# Patient Record
Sex: Male | Born: 1950 | Race: Asian | Hispanic: No | Marital: Married | Smoking: Former smoker
Health system: Southern US, Community
[De-identification: ages and names within clinical notes are randomized; demographics above are authoritative.]

## PROBLEM LIST (undated history)

## (undated) HISTORY — PX: THYROIDECTOMY: SHX17

## (undated) HISTORY — PX: COLONOSCOPY: SHX174

---

## 2013-10-03 ENCOUNTER — Other Ambulatory Visit: Payer: Self-pay | Admitting: Internal Medicine

## 2013-10-03 ENCOUNTER — Ambulatory Visit
Admission: RE | Admit: 2013-10-03 | Discharge: 2013-10-03 | Disposition: A | Discharge status: Home / Self Care | Payer: No Typology Code available for payment source | Source: Ambulatory Visit | Attending: Internal Medicine | Admitting: Internal Medicine

## 2013-10-03 DIAGNOSIS — R52 Pain, unspecified: Secondary | ICD-10-CM

## 2013-10-03 DIAGNOSIS — M542 Cervicalgia: Secondary | ICD-10-CM

## 2013-10-06 ENCOUNTER — Ambulatory Visit
Admission: RE | Admit: 2013-10-06 | Discharge: 2013-10-06 | Disposition: A | Discharge status: Home / Self Care | Payer: No Typology Code available for payment source | Source: Ambulatory Visit | Attending: Internal Medicine | Admitting: Internal Medicine

## 2013-10-06 DIAGNOSIS — M542 Cervicalgia: Secondary | ICD-10-CM

## 2013-10-09 ENCOUNTER — Other Ambulatory Visit: Payer: Self-pay | Admitting: Internal Medicine

## 2013-10-09 DIAGNOSIS — E041 Nontoxic single thyroid nodule: Secondary | ICD-10-CM

## 2013-10-18 ENCOUNTER — Ambulatory Visit
Admission: RE | Admit: 2013-10-18 | Discharge: 2013-10-18 | Disposition: A | Discharge status: Home / Self Care | Payer: No Typology Code available for payment source | Source: Ambulatory Visit | Attending: Internal Medicine | Admitting: Internal Medicine

## 2013-10-18 ENCOUNTER — Other Ambulatory Visit (HOSPITAL_COMMUNITY)
Admission: RE | Admit: 2013-10-18 | Discharge: 2013-10-18 | Disposition: A | Discharge status: Home / Self Care | Payer: No Typology Code available for payment source | Source: Ambulatory Visit | Attending: Interventional Radiology | Admitting: Interventional Radiology

## 2013-10-18 DIAGNOSIS — E041 Nontoxic single thyroid nodule: Secondary | ICD-10-CM

## 2014-10-17 ENCOUNTER — Other Ambulatory Visit: Payer: Self-pay | Admitting: Internal Medicine

## 2014-10-17 DIAGNOSIS — E042 Nontoxic multinodular goiter: Secondary | ICD-10-CM

## 2014-10-22 ENCOUNTER — Ambulatory Visit
Admission: RE | Admit: 2014-10-22 | Discharge: 2014-10-22 | Disposition: A | Discharge status: Home / Self Care | Payer: BLUE CROSS/BLUE SHIELD | Source: Ambulatory Visit | Attending: Internal Medicine | Admitting: Internal Medicine

## 2014-10-22 DIAGNOSIS — E042 Nontoxic multinodular goiter: Secondary | ICD-10-CM

## 2016-04-21 ENCOUNTER — Other Ambulatory Visit: Payer: Self-pay | Admitting: Internal Medicine

## 2016-04-21 DIAGNOSIS — E042 Nontoxic multinodular goiter: Secondary | ICD-10-CM

## 2016-04-29 ENCOUNTER — Other Ambulatory Visit: Payer: BLUE CROSS/BLUE SHIELD

## 2018-05-27 ENCOUNTER — Other Ambulatory Visit: Payer: Self-pay | Admitting: Internal Medicine

## 2018-05-27 DIAGNOSIS — E042 Nontoxic multinodular goiter: Secondary | ICD-10-CM

## 2018-06-03 ENCOUNTER — Ambulatory Visit
Admission: RE | Admit: 2018-06-03 | Discharge: 2018-06-03 | Disposition: A | Discharge status: Home / Self Care | Payer: BLUE CROSS/BLUE SHIELD | Source: Ambulatory Visit | Attending: Internal Medicine | Admitting: Internal Medicine

## 2018-06-03 DIAGNOSIS — E042 Nontoxic multinodular goiter: Secondary | ICD-10-CM

## 2018-11-24 ENCOUNTER — Ambulatory Visit (INDEPENDENT_AMBULATORY_CARE_PROVIDER_SITE_OTHER): Payer: Medicare Other

## 2018-11-24 ENCOUNTER — Encounter (INDEPENDENT_AMBULATORY_CARE_PROVIDER_SITE_OTHER): Payer: Self-pay | Admitting: Orthopedic Surgery

## 2018-11-24 ENCOUNTER — Ambulatory Visit (INDEPENDENT_AMBULATORY_CARE_PROVIDER_SITE_OTHER): Payer: Medicare Other | Admitting: Orthopedic Surgery

## 2018-11-24 DIAGNOSIS — M542 Cervicalgia: Secondary | ICD-10-CM

## 2018-11-24 DIAGNOSIS — M25512 Pain in left shoulder: Secondary | ICD-10-CM

## 2018-11-24 DIAGNOSIS — M79602 Pain in left arm: Secondary | ICD-10-CM

## 2018-11-24 DIAGNOSIS — G8929 Other chronic pain: Secondary | ICD-10-CM

## 2018-11-24 DIAGNOSIS — M50323 Other cervical disc degeneration at C6-C7 level: Secondary | ICD-10-CM

## 2018-11-24 MED ORDER — PREDNISONE 5 MG (21) PO TBPK: ORAL_TABLET | ORAL | 0 refills | Status: DC

## 2018-11-24 MED ORDER — METHOCARBAMOL 500 MG PO TABS: ORAL_TABLET | ORAL | 0 refills | Status: DC

## 2018-11-24 NOTE — Progress Notes (Signed)
Office Visit Note   Patient: Troy Larsen           Date of Birth: 1951-07-03           MRN: [id number] Visit Date: 11/24/2018 Requested by: Ralene Ok, MD [address] [facility] [address] [place], [place] [address] PCP: Ralene Ok, MD  Subjective: Chief Complaint  Patient presents with  . Left Shoulder - Pain  . Neck - Pain    HPI: Patient presents for evaluation of shoulder and neck pain primarily on the left-hand side.'s been going on for about 4 years.  Denies any history of injury.  He reports that the pain started in his left shoulder when he was receiving some immunizations before going to [place].  He is right-hand dominant.  He reports radicular pain which radiates down the arm.  It wakes him from sleep at night.  Reports numbness and tingling in the fingertips on the left-hand side but not the right.  He states that the pain is now radiating to the neck as well.  Advil has not been helpful.  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  He reports that he has pain every day more or less all the time but it is chronic.  The pain never goes away.  Does not improve with arm elevation over the head.              ROS: All systems reviewed are negative as they relate to the chief complaint within the history of present illness.  Patient denies  fevers or chills.   Assessment & Plan: Visit Diagnoses:  1. Left arm pain   2. Cervicalgia   3. Chronic left shoulder pain     Plan: Impression is radicular pain in the left arm with pretty normal shoulder exam and radiographs.  Plan is Medrol Dosepak with Robaxin and MRI of the cervical spine.  He does have C6-7 degenerative disc disease on plain radiographs.  His history and exam are more consistent with radiculopathy as opposed to intrinsic shoulder pathology.  I will see him back after that study.  Follow-Up Instructions: Return for after MRI.   Orders:  Orders Placed This Encounter  Procedures  . XR Shoulder  Left  . XR Cervical Spine 2 or 3 views  . MR Cervical Spine w/o contrast   Meds ordered this encounter  Medications  . predniSONE (STERAPRED UNI-PAK 21 TAB) 5 MG (21) TBPK tablet    Sig: Take dosepak as directed    Dispense:  21 tablet    Refill:  0  . methocarbamol (ROBAXIN) 500 MG tablet    Sig: 1 po q 8 hrs prn    Dispense:  30 tablet    Refill:  0      Procedures: No procedures performed   Clinical Data: No additional findings.  Objective: Vital Signs: There were no vitals taken for this visit.  Physical Exam:   Constitutional: Patient appears well-developed HEENT:  Head: Normocephalic Eyes:EOM are normal Neck: Normal range of motion Cardiovascular: Normal rate Pulmonary/chest: Effort normal Neurologic: Patient is alert Skin: Skin is warm Psychiatric: Patient has normal mood and affect    Ortho Exam: Ortho exam demonstrates full active and passive range of motion of the cervical spine.  He also has excellent range of motion of the left shoulder with no coarse grinding or crepitus with active or passive range of motion.  Rotator cuff strength is excellent infraspinatus supraspinatus subscap muscle testing.  No masses lymphadenopathy or skin changes noted in the shoulder girdle region.  He has no AC joint tenderness and negative impingement signs on the left.  No definite paresthesias C5-T1.  Patient has good grip EPL FPL interosseous wrist flexion extension bicep triceps and deltoid strength.  Specialty Comments:  No specialty comments available.  Imaging: Xr Cervical Spine 2 Or 3 Views  Result Date: 11/24/2018 AP lateral cervical spine reviewed.  Lateral cervical spine is the highest quality of the radiographs.  Degenerative disc disease is present at C6-7.  Normal lordosis is present.  Mild facet arthritis noted.  Xr Shoulder Left  Result Date: 11/24/2018 AP outlet and axillary left shoulder reviewed.  No glenohumeral arthritis or AC joint arthritis is  present.  Acromiohumeral distance is normal.  Visualized lung fields clear.  No fracture dislocation noted.  Normal left shoulder    PMFS History: There are no active problems to display for this patient.  History reviewed. No pertinent past medical history.  History reviewed. No pertinent family history.  History reviewed. No pertinent surgical history. Social History   Occupational History  . Not on file  Tobacco Use  . Smoking status: Not on file  Substance and Sexual Activity  . Alcohol use: Not on file  . Drug use: Not on file  . Sexual activity: Not on file

## 2018-11-29 ENCOUNTER — Ambulatory Visit
Admission: RE | Admit: 2018-11-29 | Discharge: 2018-11-29 | Disposition: A | Discharge status: Home / Self Care | Payer: Medicare Other | Source: Ambulatory Visit | Attending: Orthopedic Surgery | Admitting: Orthopedic Surgery

## 2018-11-29 DIAGNOSIS — M79602 Pain in left arm: Secondary | ICD-10-CM

## 2019-01-10 ENCOUNTER — Telehealth (INDEPENDENT_AMBULATORY_CARE_PROVIDER_SITE_OTHER): Payer: Self-pay | Admitting: Orthopedic Surgery

## 2019-01-10 NOTE — Telephone Encounter (Signed)
Patient request a call with the results of his mri. Patient's callback # [phone]

## 2019-01-10 NOTE — Telephone Encounter (Signed)
See message below. Please call patient with MRI Results. Thanks.

## 2019-01-11 NOTE — Telephone Encounter (Signed)
Nerve compression and stenosisi and herniated disc refer to Troy Larsen for f/u

## 2019-01-11 NOTE — Telephone Encounter (Signed)
IC s/w patient. Appt scheduled.

## 2019-01-20 ENCOUNTER — Encounter (INDEPENDENT_AMBULATORY_CARE_PROVIDER_SITE_OTHER): Payer: Self-pay | Admitting: Orthopaedic Surgery

## 2019-01-20 ENCOUNTER — Other Ambulatory Visit: Payer: Self-pay

## 2019-01-20 ENCOUNTER — Ambulatory Visit (INDEPENDENT_AMBULATORY_CARE_PROVIDER_SITE_OTHER): Payer: Medicare Other | Admitting: Orthopaedic Surgery

## 2019-01-20 VITALS — Ht 65.5 in | Wt 160.0 lb

## 2019-01-20 DIAGNOSIS — M4802 Spinal stenosis, cervical region: Secondary | ICD-10-CM | POA: Diagnosis not present

## 2019-01-20 DIAGNOSIS — M5020 Other cervical disc displacement, unspecified cervical region: Secondary | ICD-10-CM | POA: Diagnosis not present

## 2019-01-20 DIAGNOSIS — G9589 Other specified diseases of spinal cord: Secondary | ICD-10-CM

## 2019-01-20 NOTE — Progress Notes (Signed)
Office Visit Note   Patient: Troy Larsen           Date of Birth: 08/03/1951           MRN: [id number] Visit Date: 01/20/2019              Requested by: Ralene Ok, MD [address] [place], [place] [address] PCP: Ralene Ok, MD   Assessment & Plan: Visit Diagnoses:  1. Spinal stenosis of cervical region   2. Cervical cord myelomalacia (HCC)     Plan: Patient has 2 problems one is some disc protrusion at C6-7 on the left and this is giving him some radicular symptoms but he has no weakness and no nerve root tension signs.  He also has the cervical stenosis which is moderate to severe and there is cord myelopathic changes.  This corresponds to the C3-4 level where he has moderate to severe stenosis.  We discussed options for operative intervention discussed the C6-7 level which is giving him some radicular symptoms but no weakness.  The concern is the area where he has severe cervical stenosis with narrowing down below 7 mm in cord myelomalacia which fortunately is not associated with severe myelopathic changes.  Patient can discuss recommendations with his wife.  Long as his radicular symptoms do not progress he could have single level cervical fusion at C3-4.  He wants to delay the surgery until his next [facility] training trip and this would put surgery to mid to late August we discussed avoiding bumpy activities such as 4 wheeling, motorcycles, jumping skydiving or anything that causes bouncing or could lead to neck injury.  I will check him back again in a couple months and we also discussed possibility of treating both the C6-7 level as well as the C3-4 level at the same time 3 separate incisions.  Questions were elicited and answered we reviewed the MRI given copy of the report.  If he gets any progression of symptoms he will return sooner. Recheck one month.  Follow-Up Instructions: Return in about 1 month (around 02/19/2019).   Orders:  No orders of the defined types were placed in  this encounter.  No orders of the defined types were placed in this encounter.     Procedures: No procedures performed   Clinical Data: No additional findings.   Subjective: Chief Complaint  Patient presents with  . Neck - Pain    HPI 68 year old male with multiyear history of neck and shoulder pain with now progressive left hand numbness and tingling that radiates down to the long finger.  The hand bothers him at night sometimes neck pain wakes him up.  He denies stumbling or falling no gait disturbance.  Patient is used anti-inflammatories including Advil without relief.  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  Pain is constant time is severe other times is more mild.  He gets some improvement when he has his arm up over his head on the left negative on the right.  Patient's had an MRI scan which showed 2 problems.  One was cord myelomalacia with cervical stenosis at the C3-4 level.  The other problem was left-sided disc protrusion at the C6-7 level consistent with his radicular symptoms on the left side.  Patient also does some training sessions at [facility].  Patient denies any problems with stairs walking up hills no balance problems.  Review of Systems 14 point systems positive for  cervical stenosis with left upper extremity radicular symptoms.  Cord myelomalacia without myelopathic symptoms.  Otherwise cardiovascular respiratory endocrine negative is obtains HPI.  Objective: Vital Signs: Ht 5' 5.5" (1.664 m)   Wt 160 lb (72.6 kg)   BMI 26.22 kg/m   Physical Exam Constitutional:      Appearance: He is well-developed.  HENT:     Head: Normocephalic and atraumatic.  Eyes:     Pupils: Pupils are equal, round, and reactive to light.  Neck:     Thyroid: No thyromegaly.     Trachea: No tracheal deviation.  Cardiovascular:     Rate and Rhythm: Normal rate.  Pulmonary:     Effort: Pulmonary effort is  normal.     Breath sounds: No wheezing.  Abdominal:     General: Bowel sounds are normal.     Palpations: Abdomen is soft.  Skin:    General: Skin is warm and dry.     Capillary Refill: Capillary refill takes less than 2 seconds.  Neurological:     Mental Status: He is alert and oriented to person, place, and time.  Psychiatric:        Behavior: Behavior normal.        Thought Content: Thought content normal.        Judgment: Judgment normal.     Ortho Exam patient has no increased neck pain with cervical compression no change with distraction.  Mild brachial plexus tenderness on the left negative on the right.  Negative impingement in the shoulders full range of motion good strength deltoids of biceps triceps.  Infraspinatus subscap testing is normal.  No supraclavicular lymphadenopathy no rash over exposed skin no upper semi-atrophy.  Negative crossarm abduction test.  Negative Spurling right and left.  No thenar or hyperthenar atrophy.  Biceps triceps brachial radialis right and left are 3+ as well as 3+ knee and ankle jerk symmetrical.  Normal heel toe gait with no weakness.  Interossei of the hand flexors and extenders are normal to the digits.  Specialty Comments:  No specialty comments available.  Imaging:CLINICAL DATA:  Two year history neck pain and shoulder pain with numbness of the left hand.  EXAM: MRI CERVICAL SPINE WITHOUT CONTRAST  TECHNIQUE: Multiplanar, multisequence MR imaging of the cervical spine was performed. No intravenous contrast was administered.  COMPARISON:  Radiography 11/24/2018  FINDINGS: Alignment: Straightening of the normal cervical lordosis.  Vertebrae: No fracture or primary bone lesion.  Cord: Myelomalacia at the C3-4 level as described above.  Posterior Fossa, vertebral arteries, paraspinal tissues: Negative  Disc levels:  No abnormality at the foramen magnum or C1-2.  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 Mild bulging of the  disc. No compressive canal or foraminal stenosis. AP canal diameter 9 mm.  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 Spondylosis with endplate osteophytes and bulging of the disc. Bilateral facet degeneration. Canal stenosis shows AP diameter of 7 mm. The cord is flattened and shows increased T2 signal consistent with bilateral myelomalacia. Moderate bilateral foraminal stenosis.  C4-5: Normal interspace.  C5-6: Bulging of the disc with a shallow left paracentral protrusion with slight caudal migration. No cord compression. Mild left foraminal stenosis.  C6-7: Broad-based disc herniation more prominent towards the left. Narrowing of the subarachnoid space. Mild flattening of the left side of the cord. AP diameter in the midline 7.7 mm. Bilateral foraminal stenosis left worse than right.  C7-T1: Minimal disc bulge. Facet ligamentous prominence on the right. Mild right foraminal narrowing.  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  Chronic spondylosis with endplate osteophytes and protruding disc material. AP diameter of the canal only 7 mm. Cord atrophy and increased T2 signal consistent with chronic myelomalacia, bilateral. Bilateral foraminal stenosis that cause neural compression.  C5-6: Shallow left paracentral disc protrusion with slight caudal migration. Canal stenosis without cord compression. Foraminal narrowing left worse than right.  C6-7: Broad-based disc herniation more prominent towards the left. Canal narrowing with slight indentation of the left side of the cord. Bilateral foraminal stenosis left worse than right.   Electronically Signed   By: Paulina Fusi M.D.   On: 11/29/2018 09:53     PMFS History: There are no active problems to display for this patient.  No past medical history on file.  No family history on file.  No past surgical history on file. Social History   Occupational History  . Not on file  Tobacco Use  . Smoking status: Current  Some Day Smoker  . Smokeless tobacco: Never Used  Substance and Sexual Activity  . Alcohol use: Not on file  . Drug use: Not on file  . Sexual activity: Not on file

## 2019-01-21 DIAGNOSIS — M4802 Spinal stenosis, cervical region: Secondary | ICD-10-CM | POA: Insufficient documentation

## 2019-01-21 DIAGNOSIS — G9589 Other specified diseases of spinal cord: Secondary | ICD-10-CM | POA: Insufficient documentation

## 2019-01-21 DIAGNOSIS — M5020 Other cervical disc displacement, unspecified cervical region: Secondary | ICD-10-CM | POA: Insufficient documentation

## 2019-02-17 ENCOUNTER — Encounter: Payer: Self-pay | Admitting: Orthopaedic Surgery

## 2019-02-17 ENCOUNTER — Ambulatory Visit (INDEPENDENT_AMBULATORY_CARE_PROVIDER_SITE_OTHER): Payer: Medicare Other | Admitting: Orthopaedic Surgery

## 2019-02-17 ENCOUNTER — Other Ambulatory Visit: Payer: Self-pay

## 2019-02-17 VITALS — Ht 65.0 in | Wt 160.0 lb

## 2019-02-17 DIAGNOSIS — M7542 Impingement syndrome of left shoulder: Secondary | ICD-10-CM

## 2019-02-17 DIAGNOSIS — M4802 Spinal stenosis, cervical region: Secondary | ICD-10-CM

## 2019-02-17 DIAGNOSIS — G9589 Other specified diseases of spinal cord: Secondary | ICD-10-CM | POA: Diagnosis not present

## 2019-02-17 MED ORDER — LIDOCAINE HCL 1 % IJ SOLN
3.0000 mL | INTRAMUSCULAR | Status: AC | PRN
  Administered 2019-02-17: 3 mL

## 2019-02-17 MED ORDER — METHYLPREDNISOLONE ACETATE 40 MG/ML IJ SUSP
40.0000 mg | INTRAMUSCULAR | Status: AC | PRN
  Administered 2019-02-17: 11:00:00 40 mg via INTRA_ARTICULAR

## 2019-02-17 MED ORDER — BUPIVACAINE HCL 0.25 % IJ SOLN
4.0000 mL | INTRAMUSCULAR | Status: AC | PRN
  Administered 2019-02-17: 4 mL via INTRA_ARTICULAR

## 2019-02-17 NOTE — Progress Notes (Signed)
Office Visit Note   Patient: Troy Larsen           Date of Birth: 1951-02-20           MRN: [id number] Visit Date: 02/17/2019              Requested by: Ralene OkMoreira, Roy, MD [address] [place], [place] [id number] PCP: Ralene OkMoreira, Roy, MD   Assessment & Plan: Visit Diagnoses:  1. Spinal stenosis of cervical region   2. Cervical cord myelomalacia (HCC)   3. Impingement syndrome of left shoulder     Plan: In regards to patient's neck again he does have C3-4 stenosis with cord signal changes.  Patient again states that he is not able to have surgical invention now due to him needing to work.  He did bring up possible timing of the surgery around July, August or September.  I did advise him that he is at increased risk of paralysis due to the stenosis that he has and patient voices understanding.  Regards to left shoulder pain offered conservative treatment with injection.  After patient consent left shoulder prepped with Betadine and subacromial Marcaine/Depo-Medrol injection performed.  After sitting for a couple of minutes patient reported excellent relief of the left shoulder pain only and his range of motion also improved.  I advised him to take it easy over the weekend and not work.  Follow-up with Dr. Ophelia CharterYates for recheck of neck and shoulder at that time.  If shoulder continues to be a problem we may need to get an MRI to better evaluate his rotator cuff and will refer back to Dr. August Saucerean for that if needed.    Follow-Up Instructions: Return in about 4 weeks (around 03/17/2019) for recheck neck and left shoulder.   Orders:  No orders of the defined types were placed in this encounter.  No orders of the defined types were placed in this encounter.     Procedures: Large Joint Inj: L subacromial bursa on 02/17/2019 11:02 AM Indications: pain Details: 25 G 1.5 in needle, posterior approach Medications: 3 mL lidocaine 1 %; 4 mL bupivacaine 0.25 %; 40 mg methylPREDNISolone acetate 40 MG/ML Patient  was prepped and draped in the usual sterile fashion.       Clinical Data: No additional findings.   Subjective: Chief Complaint  Patient presents with  . Neck - Pain, Follow-up    HPI 68 year old male with a history of C3-4 stenosis cord signal changes comes in for recheck.  Dr. Ophelia CharterYates had previously discussed doing C3-4 ACDF and patient had mentioned doing this possibly in August.  He states that he does have to work and July August or September might be possible.  He has been explained the risk of not having his cervical spine treated.  He does continue to work which involves a fair amount of strenuous activity.  He also complains of ongoing left shoulder pain.  He has seen Dr. August Saucerean for this in the past.  Left shoulder pain with overhead activity and reaching on his back.  Today he denies feeling of unsteady gait, upper or lower extremity weakness. Review of Systems No current cardiac pulmonary GI GU issues  Objective: Vital Signs: Ht 5\' 5"  (1.651 m)   Wt 160 lb (72.6 kg)   BMI 26.63 kg/m   Physical Exam HENT:     Head: Normocephalic.  Pulmonary:     Effort: No respiratory distress.  Musculoskeletal:     Comments: Left shoulder he does have some  limitation in range of motion with flexion/abduction and internal rotation on his back.  Has pain with these maneuvers.  Moderate to markedly positive impingement test.  Negative drop arm.  Pain with supraspinatus resistance.  Right shoulder unremarkable.  No upper or lower extremity weakness.  Gait is normal.  Neurological:     Mental Status: He is alert and oriented to person, place, and time.  Psychiatric:        Mood and Affect: Mood normal.     Ortho Exam  Specialty Comments:  No specialty comments available.  Imaging: No results found.   PMFS History: Patient Active Problem List   Diagnosis Date Noted  . Cervical cord myelomalacia (HCC) 01/21/2019  . Spinal stenosis of cervical region 01/21/2019  . Protrusion of  cervical intervertebral disc 01/21/2019   History reviewed. No pertinent past medical history.  History reviewed. No pertinent family history.  History reviewed. No pertinent surgical history. Social History   Occupational History  . Not on file  Tobacco Use  . Smoking status: Current Some Day Smoker  . Smokeless tobacco: Never Used  Substance and Sexual Activity  . Alcohol use: Not on file  . Drug use: Not on file  . Sexual activity: Not on file

## 2019-03-31 ENCOUNTER — Encounter: Payer: Self-pay | Admitting: Orthopaedic Surgery

## 2019-03-31 ENCOUNTER — Ambulatory Visit (INDEPENDENT_AMBULATORY_CARE_PROVIDER_SITE_OTHER): Payer: Medicare Other | Admitting: Orthopaedic Surgery

## 2019-03-31 ENCOUNTER — Other Ambulatory Visit: Payer: Self-pay

## 2019-03-31 VITALS — Wt 160.0 lb

## 2019-03-31 DIAGNOSIS — M25512 Pain in left shoulder: Secondary | ICD-10-CM

## 2019-03-31 DIAGNOSIS — M5020 Other cervical disc displacement, unspecified cervical region: Secondary | ICD-10-CM

## 2019-03-31 DIAGNOSIS — M4802 Spinal stenosis, cervical region: Secondary | ICD-10-CM | POA: Diagnosis not present

## 2019-03-31 DIAGNOSIS — G8929 Other chronic pain: Secondary | ICD-10-CM

## 2019-03-31 DIAGNOSIS — G9589 Other specified diseases of spinal cord: Secondary | ICD-10-CM | POA: Diagnosis not present

## 2019-03-31 DIAGNOSIS — M7542 Impingement syndrome of left shoulder: Secondary | ICD-10-CM | POA: Diagnosis not present

## 2019-03-31 NOTE — Progress Notes (Signed)
Office Visit Note   Patient: Troy Larsen           Date of Birth: 07-15-1951           MRN: [id number] Visit Date: 03/31/2019              Requested by: Jilda Panda, MD [id number] [place],  [place] [address] PCP: Jilda Panda, MD   Assessment & Plan: Visit Diagnoses:  1. 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 Impingement syndrome of left shoulder   3. Spinal stenosis of cervical region   4. Chronic left shoulder pain   5. Protrusion of cervical intervertebral disc     Plan: Patient states he is ready to proceed with cervical fusion for his cervical stenosis with cord myelomalacia at C3-4 and correction of his cervical stenosis with disc protrusion atC6-7.  He got improvement for a few days with the shoulder injection on the left now has trouble getting his left arm over his head.  Difficult to determine whether he has rotator cuff tearing or if this is related to the cord damage from his cervical stenosis with foraminal stenosis.  Today we reviewed his MRI scan as well as the report.  Plan of the two-level cervical fusion he will have to have 2 separate incisions one at the C3-4 level on the other at C6-7 level with 2 separate plates.  Allograft bone will be used at both levels.  We discussed dysphasia dysphonia possibility of pseudoarthrosis.  We discussed postoperative use of a soft collar for 6 weeks.  Questions were elicited and answered he request we proceed.  Follow-Up Instructions: We will proceed with an MRI scan of his left shoulder rule out rotator cuff tear.  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  Procedure discussed risk surgery discussed questions elicited and answered.He understands and requests we proceed.  Orders:  Orders Placed This Encounter  Procedures  . MR Shoulder Left w/o contrast   No orders of the defined  types were placed in this encounter.     Procedures: No procedures performed   Clinical Data: No additional findings.   Subjective: Chief Complaint  Patient presents with  . Neck - Pain, Follow-up  . Left Shoulder - Pain, Follow-up    HPI 68 year old male returns with multiyear history of neck and shoulder pain which is now progressed with left hand numbness that radiates to the long finger.  Pain wakes him up at night he had an injection in his left shoulder states his left shoulder is now progressively weak again and he is having difficulty getting his arm up over his head.  He got relief from the left subacromial injection for a few days.  Patient's MRI scan showed severe stenosis AP diameter only 7 mm cord flattening and abnormal cord signal with cord myelomalacia at C3-4 and moderate by foraminal stenosis.  In addition at C6-7 level he had disc herniation more prominent to the left with narrowing of the subarachnoid space.  AP diameter was 7.7 mm and he had bilateral foraminal stenosis left greater than right.  Review of Systems negative for previous surgeries.  Positive for cervical stenosis with cord myelomalacia.  Negative cardiovascular respiratory and endocrine history.  Positive for him current impingement left shoulder.   Objective: Vital Signs: Wt 160 lb (72.6 kg)   BMI 26.63 kg/m  Physical Exam Constitutional:      Appearance: He is well-developed.  HENT:     Head: Normocephalic and atraumatic.  Eyes:     Pupils: Pupils are equal, round, and reactive to light.  Neck:     Thyroid: No thyromegaly.     Trachea: No tracheal deviation.  Cardiovascular:     Rate and Rhythm: Normal rate.  Pulmonary:     Effort: Pulmonary effort is normal.     Breath sounds: No wheezing.  Abdominal:     General: Bowel sounds are normal.     Palpations: Abdomen is soft.  Skin:    General: Skin is warm and dry.     Capillary Refill: Capillary refill takes less than 2 seconds.   Neurological:     Mental Status: He is alert and oriented to person, place, and time.  Psychiatric:        Behavior: Behavior normal.        Thought Content: Thought content normal.        Judgment: Judgment normal.     Ortho Exam patient has increased neck pain with cervical compression.  Brachial plexus tenderness on the left negative on the right.  Positive impingement left shoulder with positive Neer.  Long head of the biceps is normal.  Good strength the deltoid biceps and triceps.  No significant lymphadenopathy.  Acromioclavicular joint is normal.  Normal heel toe gait.  3+ knee and ankle jerk without clonus.  Interossei are strong.  Decreased sensation left long finger.  Right hand fingers have normal sensation.  Patient has some weakness with supraspinatus testing on the left shoulder normal on the right.  Specialty Comments:  No specialty comments available.  Imaging: CLINICAL DATA:  Two year history neck pain and shoulder pain with numbness of the left hand.  EXAM: MRI CERVICAL SPINE WITHOUT CONTRAST  TECHNIQUE: Multiplanar, multisequence MR imaging of the cervical spine was performed. No intravenous contrast was administered.  COMPARISON:  Radiography 11/24/2018  FINDINGS: Alignment: Straightening of the normal cervical lordosis.  Vertebrae: No fracture or primary bone lesion.  Cord: Myelomalacia at the C3-4 level as described above.  Posterior Fossa, vertebral arteries, paraspinal tissues: Negative  Disc levels:  No abnormality at the foramen magnum or C1-2.  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 Mild bulging of the disc. No compressive canal or foraminal stenosis. AP canal diameter 9 mm.  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 Spondylosis with endplate osteophytes and bulging of the disc. Bilateral facet degeneration. Canal stenosis shows AP diameter of 7 mm. The cord is flattened and shows increased T2 signal consistent with bilateral myelomalacia. Moderate  bilateral foraminal stenosis.  C4-5: Normal interspace.  C5-6: Bulging of the disc with a shallow left paracentral protrusion with slight caudal migration. No cord compression. Mild left foraminal stenosis.  C6-7: Broad-based disc herniation more prominent towards the left. Narrowing of the subarachnoid space. Mild flattening of the left side of the cord. AP diameter in the midline 7.7 mm. Bilateral foraminal stenosis left worse than right.  C7-T1: Minimal disc bulge. Facet ligamentous prominence on the right. Mild right foraminal narrowing.  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 Chronic spondylosis with endplate osteophytes and protruding disc material. AP diameter of the canal only 7 mm. Cord atrophy and increased T2 signal consistent with chronic myelomalacia, bilateral. Bilateral foraminal stenosis that cause neural compression.  C5-6: Shallow left paracentral disc protrusion with slight caudal migration. Canal stenosis without cord compression. Foraminal narrowing left worse than right.  C6-7: Broad-based disc herniation more   prominent towards the left. Canal narrowing with slight indentation of the left side of the cord. Bilateral foraminal stenosis left worse than right.   Electronically Signed   By: Paulina FusiMark  Shogry M.D.   On: 11/29/2018 09:53   PMFS History: Patient Active Problem List   Diagnosis Date Noted  . Impingement syndrome of left shoulder 03/31/2019  . Cervical cord myelomalacia ([facility]) 01/21/2019  . Spinal stenosis of cervical region 01/21/2019  . Protrusion of cervical intervertebral disc 01/21/2019   No past medical history on file.  No family history on file.  No past surgical history on file. Social History   Occupational History  . Not on file  Tobacco Use  . Smoking status: Current Some Day Smoker  . Smokeless tobacco: Never Used  Substance and Sexual Activity  . Alcohol use: Not on file  . Drug use: Not on file  . Sexual  activity: Not on file

## 2019-03-31 NOTE — H&P (View-Only) (Signed)
Office Visit Note   Patient: Troy Larsen           Date of Birth: 07-15-1951           MRN: [id number] Visit Date: 03/31/2019              Requested by: Jilda Panda, MD [address] [place],  [place] [address] PCP: Jilda Panda, MD   Assessment & Plan: Visit Diagnoses:  1. 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 Impingement syndrome of left shoulder   3. Spinal stenosis of cervical region   4. Chronic left shoulder pain   5. Protrusion of cervical intervertebral disc     Plan: Patient states he is ready to proceed with cervical fusion for his cervical stenosis with cord myelomalacia at C3-4 and correction of his cervical stenosis with disc protrusion atC6-7.  He got improvement for a few days with the shoulder injection on the left now has trouble getting his left arm over his head.  Difficult to determine whether he has rotator cuff tearing or if this is related to the cord damage from his cervical stenosis with foraminal stenosis.  Today we reviewed his MRI scan as well as the report.  Plan of the two-level cervical fusion he will have to have 2 separate incisions one at the C3-4 level on the other at C6-7 level with 2 separate plates.  Allograft bone will be used at both levels.  We discussed dysphasia dysphonia possibility of pseudoarthrosis.  We discussed postoperative use of a soft collar for 6 weeks.  Questions were elicited and answered he request we proceed.  Follow-Up Instructions: We will proceed with an MRI scan of his left shoulder rule out rotator cuff tear.  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  Procedure discussed risk surgery discussed questions elicited and answered.He understands and requests we proceed.  Orders:  Orders Placed This Encounter  Procedures  . MR Shoulder Left w/o contrast   No orders of the defined  types were placed in this encounter.     Procedures: No procedures performed   Clinical Data: No additional findings.   Subjective: Chief Complaint  Patient presents with  . Neck - Pain, Follow-up  . Left Shoulder - Pain, Follow-up    HPI 68 year old male returns with multiyear history of neck and shoulder pain which is now progressed with left hand numbness that radiates to the long finger.  Pain wakes him up at night he had an injection in his left shoulder states his left shoulder is now progressively weak again and he is having difficulty getting his arm up over his head.  He got relief from the left subacromial injection for a few days.  Patient's MRI scan showed severe stenosis AP diameter only 7 mm cord flattening and abnormal cord signal with cord myelomalacia at C3-4 and moderate by foraminal stenosis.  In addition at C6-7 level he had disc herniation more prominent to the left with narrowing of the subarachnoid space.  AP diameter was 7.7 mm and he had bilateral foraminal stenosis left greater than right.  Review of Systems negative for previous surgeries.  Positive for cervical stenosis with cord myelomalacia.  Negative cardiovascular respiratory and endocrine history.  Positive for him current impingement left shoulder.   Objective: Vital Signs: Wt 160 lb (72.6 kg)   BMI 26.63 kg/m  Physical Exam Constitutional:      Appearance: He is well-developed.  HENT:     Head: Normocephalic and atraumatic.  Eyes:     Pupils: Pupils are equal, round, and reactive to light.  Neck:     Thyroid: No thyromegaly.     Trachea: No tracheal deviation.  Cardiovascular:     Rate and Rhythm: Normal rate.  Pulmonary:     Effort: Pulmonary effort is normal.     Breath sounds: No wheezing.  Abdominal:     General: Bowel sounds are normal.     Palpations: Abdomen is soft.  Skin:    General: Skin is warm and dry.     Capillary Refill: Capillary refill takes less than 2 seconds.   Neurological:     Mental Status: He is alert and oriented to person, place, and time.  Psychiatric:        Behavior: Behavior normal.        Thought Content: Thought content normal.        Judgment: Judgment normal.     Ortho Exam patient has increased neck pain with cervical compression.  Brachial plexus tenderness on the left negative on the right.  Positive impingement left shoulder with positive Neer.  Long head of the biceps is normal.  Good strength the deltoid biceps and triceps.  No significant lymphadenopathy.  Acromioclavicular joint is normal.  Normal heel toe gait.  3+ knee and ankle jerk without clonus.  Interossei are strong.  Decreased sensation left long finger.  Right hand fingers have normal sensation.  Patient has some weakness with supraspinatus testing on the left shoulder normal on the right.  Specialty Comments:  No specialty comments available.  Imaging: CLINICAL DATA:  Two year history neck pain and shoulder pain with numbness of the left hand.  EXAM: MRI CERVICAL SPINE WITHOUT CONTRAST  TECHNIQUE: Multiplanar, multisequence MR imaging of the cervical spine was performed. No intravenous contrast was administered.  COMPARISON:  Radiography 11/24/2018  FINDINGS: Alignment: Straightening of the normal cervical lordosis.  Vertebrae: No fracture or primary bone lesion.  Cord: Myelomalacia at the C3-4 level as described above.  Posterior Fossa, vertebral arteries, paraspinal tissues: Negative  Disc levels:  No abnormality at the foramen magnum or C1-2.  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 Mild bulging of the disc. No compressive canal or foraminal stenosis. AP canal diameter 9 mm.  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 Spondylosis with endplate osteophytes and bulging of the disc. Bilateral facet degeneration. Canal stenosis shows AP diameter of 7 mm. The cord is flattened and shows increased T2 signal consistent with bilateral myelomalacia. Moderate  bilateral foraminal stenosis.  C4-5: Normal interspace.  C5-6: Bulging of the disc with a shallow left paracentral protrusion with slight caudal migration. No cord compression. Mild left foraminal stenosis.  C6-7: Broad-based disc herniation more prominent towards the left. Narrowing of the subarachnoid space. Mild flattening of the left side of the cord. AP diameter in the midline 7.7 mm. Bilateral foraminal stenosis left worse than right.  C7-T1: Minimal disc bulge. Facet ligamentous prominence on the right. Mild right foraminal narrowing.  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 Chronic spondylosis with endplate osteophytes and protruding disc material. AP diameter of the canal only 7 mm. Cord atrophy and increased T2 signal consistent with chronic myelomalacia, bilateral. Bilateral foraminal stenosis that cause neural compression.  C5-6: Shallow left paracentral disc protrusion with slight caudal migration. Canal stenosis without cord compression. Foraminal narrowing left worse than right.  C6-7: Broad-based disc herniation more  prominent towards the left. Canal narrowing with slight indentation of the left side of the cord. Bilateral foraminal stenosis left worse than right.   Electronically Signed   By: Paulina FusiMark  Shogry M.D.   On: 11/29/2018 09:53   PMFS History: Patient Active Problem List   Diagnosis Date Noted  . Impingement syndrome of left shoulder 03/31/2019  . Cervical cord myelomalacia ([facility]) 01/21/2019  . Spinal stenosis of cervical region 01/21/2019  . Protrusion of cervical intervertebral disc 01/21/2019   No past medical history on file.  No family history on file.  No past surgical history on file. Social History   Occupational History  . Not on file  Tobacco Use  . Smoking status: Current Some Day Smoker  . Smokeless tobacco: Never Used  Substance and Sexual Activity  . Alcohol use: Not on file  . Drug use: Not on file  . Sexual  activity: Not on file

## 2019-04-01 ENCOUNTER — Other Ambulatory Visit (HOSPITAL_COMMUNITY)
Admission: RE | Admit: 2019-04-01 | Discharge: 2019-04-01 | Disposition: A | Discharge status: Home / Self Care | Payer: Medicare Other | Source: Ambulatory Visit | Attending: Orthopaedic Surgery | Admitting: Orthopaedic Surgery

## 2019-04-01 DIAGNOSIS — Z1159 Encounter for screening for other viral diseases: Secondary | ICD-10-CM | POA: Diagnosis present

## 2019-04-02 LAB — SARS CORONAVIRUS 2 (TAT 6-24 HRS): SARS Coronavirus 2: NEGATIVE

## 2019-04-04 ENCOUNTER — Other Ambulatory Visit: Payer: Self-pay

## 2019-04-04 ENCOUNTER — Encounter (HOSPITAL_COMMUNITY): Payer: Self-pay | Admitting: *Deleted

## 2019-04-04 NOTE — Progress Notes (Signed)
Pre-op instructions given to patient  Instructed to not take any advil or celebrex until surgery

## 2019-04-05 ENCOUNTER — Other Ambulatory Visit: Payer: Self-pay

## 2019-04-05 ENCOUNTER — Observation Stay (HOSPITAL_COMMUNITY): Payer: Medicare Other

## 2019-04-05 ENCOUNTER — Observation Stay (HOSPITAL_COMMUNITY)
Admission: RE | Admit: 2019-04-05 | Discharge: 2019-04-06 | Disposition: A | Discharge status: Home / Self Care | Payer: Medicare Other | Attending: Orthopaedic Surgery | Admitting: Orthopaedic Surgery

## 2019-04-05 ENCOUNTER — Ambulatory Visit (HOSPITAL_COMMUNITY): Payer: Medicare Other | Admitting: Anesthesiology

## 2019-04-05 ENCOUNTER — Encounter (HOSPITAL_COMMUNITY)
Admission: RE | Disposition: A | Discharge status: Home / Self Care | Payer: Self-pay | Source: Home / Self Care | Attending: Orthopaedic Surgery

## 2019-04-05 ENCOUNTER — Encounter (HOSPITAL_COMMUNITY): Payer: Self-pay

## 2019-04-05 DIAGNOSIS — M7542 Impingement syndrome of left shoulder: Secondary | ICD-10-CM | POA: Insufficient documentation

## 2019-04-05 DIAGNOSIS — G8929 Other chronic pain: Secondary | ICD-10-CM | POA: Diagnosis not present

## 2019-04-05 DIAGNOSIS — G9589 Other specified diseases of spinal cord: Secondary | ICD-10-CM | POA: Insufficient documentation

## 2019-04-05 DIAGNOSIS — M4802 Spinal stenosis, cervical region: Principal | ICD-10-CM

## 2019-04-05 DIAGNOSIS — F17200 Nicotine dependence, unspecified, uncomplicated: Secondary | ICD-10-CM | POA: Diagnosis not present

## 2019-04-05 DIAGNOSIS — M50123 Cervical disc disorder at C6-C7 level with radiculopathy: Secondary | ICD-10-CM | POA: Insufficient documentation

## 2019-04-05 DIAGNOSIS — Z791 Long term (current) use of non-steroidal anti-inflammatories (NSAID): Secondary | ICD-10-CM | POA: Insufficient documentation

## 2019-04-05 HISTORY — PX: ANTERIOR CERVICAL DECOMP/DISCECTOMY FUSION: SHX1161

## 2019-04-05 LAB — SURGICAL PCR SCREEN
MRSA, PCR: NEGATIVE
Staphylococcus aureus: NEGATIVE

## 2019-04-05 LAB — COMPREHENSIVE METABOLIC PANEL
ALT: 23 U/L (ref 0–44)
AST: 23 U/L (ref 15–41)
Albumin: 4.4 g/dL (ref 3.5–5.0)
Alkaline Phosphatase: 37 U/L — ABNORMAL LOW (ref 38–126)
Anion gap: 8 (ref 5–15)
BUN: 14 mg/dL (ref 8–23)
CO2: 25 mmol/L (ref 22–32)
Calcium: 9.3 mg/dL (ref 8.9–10.3)
Chloride: 106 mmol/L (ref 98–111)
Creatinine, Ser: 0.95 mg/dL (ref 0.61–1.24)
GFR calc Af Amer: >60 mL/min (ref >60)
GFR calc non Af Amer: >60 mL/min (ref >60)
Glucose, Bld: 98 mg/dL (ref 70–99)
Potassium: 3.4 mmol/L — ABNORMAL LOW (ref 3.5–5.1)
Sodium: 139 mmol/L (ref 135–145)
Total Bilirubin: 1.1 mg/dL (ref 0.3–1.2)
Total Protein: 7.2 g/dL (ref 6.5–8.1)

## 2019-04-05 LAB — CBC
HCT: 40.6 % (ref 39.0–52.0)
Hemoglobin: 13.6 g/dL (ref 13.0–17.0)
MCH: 28 pg (ref 26.0–34.0)
MCHC: 33.5 g/dL (ref 30.0–36.0)
MCV: 83.5 fL (ref 80.0–100.0)
Platelets: 174 10*3/uL (ref 150–400)
RBC: 4.86 MIL/uL (ref 4.22–5.81)
RDW: 14.6 % (ref 11.5–15.5)
WBC: 4.2 10*3/uL (ref 4.0–10.5)
nRBC: 0 % (ref 0.0–0.2)

## 2019-04-05 SURGERY — ANTERIOR CERVICAL DECOMPRESSION/DISCECTOMY FUSION 2 LEVELS
Anesthesia: General | Site: Neck

## 2019-04-05 MED ORDER — HYDROMORPHONE HCL 1 MG/ML IJ SOLN
0.5000 mg | INTRAMUSCULAR | Status: DC | PRN
  Administered 2019-04-06 (×2): 0.5 mg via INTRAVENOUS
  Filled 2019-04-05 (×2): qty 1

## 2019-04-05 MED ORDER — PHENOL 1.4 % MT LIQD
1.0000 | OROMUCOSAL | Status: DC | PRN
  Administered 2019-04-06: 1 via OROMUCOSAL
  Filled 2019-04-05: qty 177

## 2019-04-05 MED ORDER — SUCCINYLCHOLINE CHLORIDE 20 MG/ML IJ SOLN
INTRAMUSCULAR | Status: DC | PRN
  Administered 2019-04-05: 120 mg via INTRAVENOUS

## 2019-04-05 MED ORDER — 0.9 % SODIUM CHLORIDE (POUR BTL) OPTIME
TOPICAL | Status: DC | PRN
  Administered 2019-04-05: 1000 mL

## 2019-04-05 MED ORDER — SUGAMMADEX SODIUM 200 MG/2ML IV SOLN
INTRAVENOUS | Status: DC | PRN
  Administered 2019-04-05: 200 mg via INTRAVENOUS

## 2019-04-05 MED ORDER — ROCURONIUM BROMIDE 10 MG/ML (PF) SYRINGE
PREFILLED_SYRINGE | INTRAVENOUS | Status: AC
  Filled 2019-04-05: qty 10

## 2019-04-05 MED ORDER — HEMOSTATIC AGENTS (NO CHARGE) OPTIME
TOPICAL | Status: DC | PRN
  Administered 2019-04-05: 1

## 2019-04-05 MED ORDER — LACTATED RINGERS IV SOLN
INTRAVENOUS | Status: DC
  Administered 2019-04-05 (×2): via INTRAVENOUS

## 2019-04-05 MED ORDER — POLYETHYLENE GLYCOL 3350 17 G PO PACK
17.0000 g | PACK | Freq: Every day | ORAL | Status: DC
  Administered 2019-04-06: 17 g via ORAL
  Filled 2019-04-05: qty 1

## 2019-04-05 MED ORDER — PROPOFOL 10 MG/ML IV BOLUS
INTRAVENOUS | Status: AC
  Filled 2019-04-05: qty 20

## 2019-04-05 MED ORDER — SODIUM CHLORIDE 0.9 % IV SOLN
INTRAVENOUS | Status: DC | PRN
  Administered 2019-04-05: 30 ug/min via INTRAVENOUS

## 2019-04-05 MED ORDER — PROMETHAZINE HCL 25 MG/ML IJ SOLN
INTRAMUSCULAR | Status: AC
  Filled 2019-04-05: qty 1

## 2019-04-05 MED ORDER — CHLORHEXIDINE GLUCONATE 4 % EX LIQD: 60.0000 mL | Freq: Once | CUTANEOUS | Status: DC

## 2019-04-05 MED ORDER — HYDROMORPHONE HCL 1 MG/ML IJ SOLN
INTRAMUSCULAR | Status: AC
  Filled 2019-04-05: qty 1

## 2019-04-05 MED ORDER — ROCURONIUM BROMIDE 100 MG/10ML IV SOLN
INTRAVENOUS | Status: DC | PRN
  Administered 2019-04-05: 60 mg via INTRAVENOUS

## 2019-04-05 MED ORDER — ACETAMINOPHEN 325 MG PO TABS: 650.0000 mg | ORAL_TABLET | ORAL | Status: DC | PRN

## 2019-04-05 MED ORDER — PROPOFOL 10 MG/ML IV BOLUS
INTRAVENOUS | Status: DC | PRN
  Administered 2019-04-05: 150 mg via INTRAVENOUS
  Administered 2019-04-05: 40 mg via INTRAVENOUS

## 2019-04-05 MED ORDER — MEPERIDINE HCL 25 MG/ML IJ SOLN: 6.2500 mg | INTRAMUSCULAR | Status: DC | PRN

## 2019-04-05 MED ORDER — SODIUM CHLORIDE 0.9% FLUSH: 3.0000 mL | Freq: Two times a day (BID) | INTRAVENOUS | Status: DC

## 2019-04-05 MED ORDER — KETOROLAC TROMETHAMINE 30 MG/ML IJ SOLN
30.0000 mg | Freq: Once | INTRAMUSCULAR | Status: AC
  Administered 2019-04-05: 21:00:00 30 mg via INTRAVENOUS

## 2019-04-05 MED ORDER — METHOCARBAMOL 1000 MG/10ML IJ SOLN
500.0000 mg | Freq: Four times a day (QID) | INTRAVENOUS | Status: DC | PRN
  Filled 2019-04-05: qty 5

## 2019-04-05 MED ORDER — FENTANYL CITRATE (PF) 100 MCG/2ML IJ SOLN
INTRAMUSCULAR | Status: DC | PRN
  Administered 2019-04-05 (×2): 100 ug via INTRAVENOUS
  Administered 2019-04-05: 50 ug via INTRAVENOUS

## 2019-04-05 MED ORDER — ONDANSETRON HCL 4 MG/2ML IJ SOLN
INTRAMUSCULAR | Status: DC | PRN
  Administered 2019-04-05: 4 mg via INTRAVENOUS

## 2019-04-05 MED ORDER — SODIUM CHLORIDE 0.9 % IV SOLN: 250.0000 mL | INTRAVENOUS | Status: DC

## 2019-04-05 MED ORDER — LIDOCAINE HCL (CARDIAC) PF 100 MG/5ML IV SOSY
PREFILLED_SYRINGE | INTRAVENOUS | Status: DC | PRN
  Administered 2019-04-05: 60 mg via INTRAVENOUS

## 2019-04-05 MED ORDER — EPHEDRINE 5 MG/ML INJ
INTRAVENOUS | Status: AC
  Filled 2019-04-05: qty 30

## 2019-04-05 MED ORDER — ACETAMINOPHEN 10 MG/ML IV SOLN
INTRAVENOUS | Status: AC
  Filled 2019-04-05: qty 100

## 2019-04-05 MED ORDER — MENTHOL 3 MG MT LOZG: 1.0000 | LOZENGE | OROMUCOSAL | Status: DC | PRN

## 2019-04-05 MED ORDER — METHOCARBAMOL 500 MG PO TABS: 500.0000 mg | ORAL_TABLET | Freq: Four times a day (QID) | ORAL | Status: DC | PRN

## 2019-04-05 MED ORDER — SODIUM CHLORIDE 0.9% FLUSH: 3.0000 mL | INTRAVENOUS | Status: DC | PRN

## 2019-04-05 MED ORDER — ONDANSETRON HCL 4 MG/2ML IJ SOLN
INTRAMUSCULAR | Status: AC
  Filled 2019-04-05: qty 2

## 2019-04-05 MED ORDER — HYDROMORPHONE HCL 1 MG/ML IJ SOLN
0.2500 mg | INTRAMUSCULAR | Status: DC | PRN
  Administered 2019-04-05 (×2): 0.5 mg via INTRAVENOUS

## 2019-04-05 MED ORDER — KETOROLAC TROMETHAMINE 30 MG/ML IJ SOLN
INTRAMUSCULAR | Status: AC
  Filled 2019-04-05: qty 1

## 2019-04-05 MED ORDER — ONDANSETRON HCL 4 MG PO TABS: 4.0000 mg | ORAL_TABLET | Freq: Four times a day (QID) | ORAL | Status: DC | PRN

## 2019-04-05 MED ORDER — BUPIVACAINE-EPINEPHRINE (PF) 0.5% -1:200000 IJ SOLN
INTRAMUSCULAR | Status: AC
  Filled 2019-04-05: qty 30

## 2019-04-05 MED ORDER — PHENYLEPHRINE 40 MCG/ML (10ML) SYRINGE FOR IV PUSH (FOR BLOOD PRESSURE SUPPORT)
PREFILLED_SYRINGE | INTRAVENOUS | Status: AC
  Filled 2019-04-05: qty 20

## 2019-04-05 MED ORDER — DEXAMETHASONE SODIUM PHOSPHATE 10 MG/ML IJ SOLN
INTRAMUSCULAR | Status: AC
  Filled 2019-04-05: qty 2

## 2019-04-05 MED ORDER — PHENYLEPHRINE HCL (PRESSORS) 10 MG/ML IV SOLN
INTRAVENOUS | Status: DC | PRN
  Administered 2019-04-05 (×2): 80 ug via INTRAVENOUS

## 2019-04-05 MED ORDER — ACETAMINOPHEN 650 MG RE SUPP: 650.0000 mg | RECTAL | Status: DC | PRN

## 2019-04-05 MED ORDER — CEFAZOLIN SODIUM-DEXTROSE 1-4 GM/50ML-% IV SOLN
1.0000 g | Freq: Three times a day (TID) | INTRAVENOUS | Status: DC
  Administered 2019-04-06: 01:00:00 1 g via INTRAVENOUS
  Filled 2019-04-05: qty 50

## 2019-04-05 MED ORDER — SUCCINYLCHOLINE CHLORIDE 200 MG/10ML IV SOSY
PREFILLED_SYRINGE | INTRAVENOUS | Status: AC
  Filled 2019-04-05: qty 10

## 2019-04-05 MED ORDER — SODIUM CHLORIDE 0.9 % IV SOLN
INTRAVENOUS | Status: DC
  Administered 2019-04-05: 22:00:00 via INTRAVENOUS

## 2019-04-05 MED ORDER — BUPIVACAINE-EPINEPHRINE 0.5% -1:200000 IJ SOLN
INTRAMUSCULAR | Status: DC | PRN
  Administered 2019-04-05: 7 mL

## 2019-04-05 MED ORDER — EPHEDRINE SULFATE 50 MG/ML IJ SOLN
INTRAMUSCULAR | Status: DC | PRN
  Administered 2019-04-05: 5 mg via INTRAVENOUS
  Administered 2019-04-05: 10 mg via INTRAVENOUS

## 2019-04-05 MED ORDER — LIDOCAINE 2% (20 MG/ML) 5 ML SYRINGE
INTRAMUSCULAR | Status: AC
  Filled 2019-04-05: qty 5

## 2019-04-05 MED ORDER — ONDANSETRON HCL 4 MG/2ML IJ SOLN: 4.0000 mg | Freq: Four times a day (QID) | INTRAMUSCULAR | Status: DC | PRN

## 2019-04-05 MED ORDER — MIDAZOLAM HCL 2 MG/2ML IJ SOLN
INTRAMUSCULAR | Status: AC
  Filled 2019-04-05: qty 2

## 2019-04-05 MED ORDER — OXYCODONE HCL 5 MG PO TABS
5.0000 mg | ORAL_TABLET | ORAL | Status: DC | PRN
  Administered 2019-04-06: 5 mg via ORAL
  Filled 2019-04-05: qty 1

## 2019-04-05 MED ORDER — CEFAZOLIN SODIUM-DEXTROSE 2-4 GM/100ML-% IV SOLN
INTRAVENOUS | Status: AC
  Filled 2019-04-05: qty 100

## 2019-04-05 MED ORDER — DEXAMETHASONE SODIUM PHOSPHATE 10 MG/ML IJ SOLN
INTRAMUSCULAR | Status: DC | PRN
  Administered 2019-04-05: 10 mg via INTRAVENOUS

## 2019-04-05 MED ORDER — PROMETHAZINE HCL 25 MG/ML IJ SOLN
6.2500 mg | INTRAMUSCULAR | Status: DC | PRN
  Administered 2019-04-05: 21:00:00 6.25 mg via INTRAVENOUS

## 2019-04-05 MED ORDER — DOCUSATE SODIUM 100 MG PO CAPS
100.0000 mg | ORAL_CAPSULE | Freq: Two times a day (BID) | ORAL | Status: DC
  Administered 2019-04-06: 100 mg via ORAL
  Filled 2019-04-05: qty 1

## 2019-04-05 MED ORDER — ARTIFICIAL TEARS OPHTHALMIC OINT
TOPICAL_OINTMENT | OPHTHALMIC | Status: AC
  Filled 2019-04-05: qty 3.5

## 2019-04-05 MED ORDER — MIDAZOLAM HCL 5 MG/5ML IJ SOLN
INTRAMUSCULAR | Status: DC | PRN
  Administered 2019-04-05: 2 mg via INTRAVENOUS

## 2019-04-05 MED ORDER — FENTANYL CITRATE (PF) 250 MCG/5ML IJ SOLN
INTRAMUSCULAR | Status: AC
  Filled 2019-04-05: qty 5

## 2019-04-05 MED ORDER — CEFAZOLIN SODIUM-DEXTROSE 2-4 GM/100ML-% IV SOLN
2.0000 g | INTRAVENOUS | Status: AC
  Administered 2019-04-05: 2 g via INTRAVENOUS

## 2019-04-05 MED ORDER — ACETAMINOPHEN 10 MG/ML IV SOLN
1000.0000 mg | Freq: Once | INTRAVENOUS | Status: AC
  Administered 2019-04-05: 21:00:00 1000 mg via INTRAVENOUS

## 2019-04-05 SURGICAL SUPPLY — 62 items
AGENT HMST KT MTR STRL THRMB (HEMOSTASIS)
APL SKNCLS STERI-STRIP NONHPOA (GAUZE/BANDAGES/DRESSINGS) ×1
BENZOIN TINCTURE PRP APPL 2/3 (GAUZE/BANDAGES/DRESSINGS) ×3 IMPLANT
BIT DRILL SRG 14X2.2XFLT CHK (BIT) IMPLANT
BIT DRL SRG 14X2.2XFLT CHK (BIT) ×1
BONE CERV LORDOTIC 14.5X12X6 (Bone Implant) ×3 IMPLANT
BONE CERV LORDOTIC 14.5X12X7 (Bone Implant) ×3 IMPLANT
BUR ROUND FLUTED 4 SOFT TCH (BURR) ×1 IMPLANT
BUR ROUND FLUTED 4MM SOFT TCH (BURR) ×1
CLOSURE STERI-STRIP 1/4X4 (GAUZE/BANDAGES/DRESSINGS) ×2 IMPLANT
CLOSURE WOUND 1/2 X4 (GAUZE/BANDAGES/DRESSINGS) ×1
COLLAR CERV LO CONTOUR FIRM DE (SOFTGOODS) IMPLANT
CORD BIPOLAR FORCEPS 12FT (ELECTRODE) ×3 IMPLANT
COVER SURGICAL LIGHT HANDLE (MISCELLANEOUS) ×3 IMPLANT
COVER WAND RF STERILE (DRAPES) ×3 IMPLANT
DRAPE C-ARM 42X72 X-RAY (DRAPES) ×3 IMPLANT
DRAPE C-ARMOR (DRAPES) ×2 IMPLANT
DRAPE HALF SHEET 40X57 (DRAPES) ×3 IMPLANT
DRAPE MICROSCOPE LEICA (MISCELLANEOUS) ×3 IMPLANT
DRILL BIT SKYLINE 14MM (BIT) ×3
DURAPREP 6ML APPLICATOR 50/CS (WOUND CARE) ×3 IMPLANT
ELECT COATED BLADE 2.86 ST (ELECTRODE) ×3 IMPLANT
ELECT REM PT RETURN 9FT ADLT (ELECTROSURGICAL) ×3
ELECTRODE REM PT RTRN 9FT ADLT (ELECTROSURGICAL) ×1 IMPLANT
EVACUATOR 1/8 PVC DRAIN (DRAIN) ×3 IMPLANT
GAUZE SPONGE 4X4 12PLY STRL (GAUZE/BANDAGES/DRESSINGS) ×3 IMPLANT
GLOVE BIOGEL PI IND STRL 8 (GLOVE) ×2 IMPLANT
GLOVE BIOGEL PI INDICATOR 8 (GLOVE) ×4
GLOVE ORTHO TXT STRL SZ7.5 (GLOVE) ×6 IMPLANT
GOWN STRL REUS W/ TWL LRG LVL3 (GOWN DISPOSABLE) ×1 IMPLANT
GOWN STRL REUS W/ TWL XL LVL3 (GOWN DISPOSABLE) ×1 IMPLANT
GOWN STRL REUS W/TWL 2XL LVL3 (GOWN DISPOSABLE) ×3 IMPLANT
GOWN STRL REUS W/TWL LRG LVL3 (GOWN DISPOSABLE) ×3
GOWN STRL REUS W/TWL XL LVL3 (GOWN DISPOSABLE) ×3
GRAFT BNE SPCR VG2 14.5X12X6 (Bone Implant) IMPLANT
GRAFT BNE SPCR VG2 14.5X12X7 (Bone Implant) IMPLANT
HEAD HALTER (SOFTGOODS) ×3 IMPLANT
HEMOSTAT SURGICEL 2X14 (HEMOSTASIS) IMPLANT
KIT BASIN OR (CUSTOM PROCEDURE TRAY) ×3 IMPLANT
KIT TURNOVER KIT B (KITS) ×3 IMPLANT
MANIFOLD NEPTUNE II (INSTRUMENTS) IMPLANT
NDL 25GX 5/8IN NON SAFETY (NEEDLE) ×1 IMPLANT
NEEDLE 25GX 5/8IN NON SAFETY (NEEDLE) ×3 IMPLANT
NS IRRIG 1000ML POUR BTL (IV SOLUTION) ×3 IMPLANT
PACK ORTHO CERVICAL (CUSTOM PROCEDURE TRAY) ×3 IMPLANT
PAD ARMBOARD 7.5X6 YLW CONV (MISCELLANEOUS) ×6 IMPLANT
PATTIES SURGICAL .5 X.5 (GAUZE/BANDAGES/DRESSINGS) IMPLANT
PIN TEMP SKYLINE THREADED (PIN) ×2 IMPLANT
PLATE ONE LEVEL SKYLINE 14MM (Plate) ×4 IMPLANT
POSITIONER HEAD DONUT 9IN (MISCELLANEOUS) ×3 IMPLANT
RESTRAINT LIMB HOLDER UNIV (RESTRAINTS) IMPLANT
SCREW VARIABLE SELF TAP 12MM (Screw) ×16 IMPLANT
STRIP CLOSURE SKIN 1/2X4 (GAUZE/BANDAGES/DRESSINGS) ×2 IMPLANT
SURGIFLO W/THROMBIN 8M KIT (HEMOSTASIS) IMPLANT
SUT BONE WAX W31G (SUTURE) ×3 IMPLANT
SUT VIC AB 3-0 X1 27 (SUTURE) ×5 IMPLANT
SUT VIC AB 4-0 PS2 18 (SUTURE) ×2 IMPLANT
SUT VICRYL 4-0 PS2 18IN ABS (SUTURE) ×6 IMPLANT
TAPE CLOTH SURG 4X10 WHT LF (GAUZE/BANDAGES/DRESSINGS) ×2 IMPLANT
TOWEL GREEN STERILE (TOWEL DISPOSABLE) ×3 IMPLANT
TOWEL GREEN STERILE FF (TOWEL DISPOSABLE) ×3 IMPLANT
TRAY FOLEY CATH SILVER 16FR (SET/KITS/TRAYS/PACK) IMPLANT

## 2019-04-05 NOTE — Interval H&P Note (Signed)
History and Physical Interval Note:  04/05/2019 5:03 PM  Troy Larsen  has presented today for surgery, with the diagnosis of c3-4, c6-7 cervical stenosis, myeolmalacia.  The various methods of treatment have been discussed with the patient and family. After consideration of risks, benefits and other options for treatment, the patient has consented to  Procedure(s): C3-4, C6-7 ANTERIOR CERVICAL DECOMPRESSION/DISCECTOMY FUSION 2 LEVELS, ALLOGRAFT, PLATES (N/A) as a surgical intervention.  The patient's history has been reviewed, patient examined, no change in status, stable for surgery.  I have reviewed the patient's chart and labs.  Questions were answered to the patient's satisfaction.     Marybelle Killings

## 2019-04-05 NOTE — Progress Notes (Signed)
Attempted report x1. 

## 2019-04-05 NOTE — Anesthesia Procedure Notes (Signed)
Procedure Name: Intubation Date/Time: 04/05/2019 5:54 PM Performed by: Donnel Venuto T, CRNA Pre-anesthesia Checklist: Patient identified, Emergency Drugs available, Suction available and Patient being monitored Patient Re-evaluated:Patient Re-evaluated prior to induction Oxygen Delivery Method: Circle system utilized Preoxygenation: Pre-oxygenation with 100% oxygen Induction Type: IV induction and Rapid sequence Laryngoscope Size: Glidescope and 4 Tube type: Oral Tube size: 7.5 mm Number of attempts: 1 Airway Equipment and Method: Patient positioned with wedge pillow,  Stylet and Video-laryngoscopy Placement Confirmation: ETT inserted through vocal cords under direct vision,  positive ETCO2 and breath sounds checked- equal and bilateral Secured at: 22 cm Tube secured with: Tape Dental Injury: Teeth and Oropharynx as per pre-operative assessment

## 2019-04-05 NOTE — Transfer of Care (Signed)
Immediate Anesthesia Transfer of Care Note  Patient: Troy Larsen  Procedure(s) Performed: Cervical three-four, Cervical six-seven  ANTERIOR CERVICAL DECOMPRESSION/DISCECTOMY FUSION 2 LEVELS, ALLOGRAFT, PLATES (N/A Neck)  Patient Location: PACU  Anesthesia Type:General  Level of Consciousness: drowsy  Airway & Oxygen Therapy: Patient Spontanous Breathing and Patient connected to face mask oxygen  Post-op Assessment: Report given to RN and Post -op Vital signs reviewed and stable  Post vital signs: Reviewed and stable  Last Vitals:  Vitals Value Taken Time  BP 135/88 04/05/19 2029  Temp 36.2 C 04/05/19 2028  Pulse 75 04/05/19 2030  Resp 13 04/05/19 2030  SpO2 100 % 04/05/19 2030  Vitals shown include unvalidated device data.  Last Pain:  Vitals:   04/05/19 1342  PainSc: 0-No pain      Patients Stated Pain Goal: 2 (73/22/02 5427)  Complications: No apparent anesthesia complications

## 2019-04-05 NOTE — Anesthesia Postprocedure Evaluation (Signed)
Anesthesia Post Note  Patient: Troy Larsen  Procedure(s) Performed: Cervical three-four, Cervical six-seven  ANTERIOR CERVICAL DECOMPRESSION/DISCECTOMY FUSION 2 LEVELS, ALLOGRAFT, PLATES (N/A Neck)     Patient location during evaluation: PACU Anesthesia Type: General Level of consciousness: awake and alert, patient cooperative and oriented Pain management: pain level controlled Vital Signs Assessment: post-procedure vital signs reviewed and stable Respiratory status: spontaneous breathing, nonlabored ventilation, respiratory function stable and patient connected to nasal cannula oxygen Cardiovascular status: blood pressure returned to baseline and stable Postop Assessment: no apparent nausea or vomiting Anesthetic complications: no    Last Vitals:  Vitals:   04/05/19 2130 04/05/19 2210  BP: 127/74 126/79  Pulse: 68 69  Resp: 10 18  Temp:  36.7 C  SpO2: 97% 100%    Last Pain:  Vitals:   04/05/19 2210  TempSrc: Oral  PainSc:                  Maryfer Tauzin,E. Slaton Reaser

## 2019-04-05 NOTE — Anesthesia Preprocedure Evaluation (Signed)
Anesthesia Evaluation  Patient identified by MRN, date of birth, ID band Patient awake    Reviewed: Allergy & Precautions, NPO status , Patient's Chart, lab work & pertinent test results  Airway Mallampati: II  TM Distance: >3 FB Neck ROM: Full    Dental no notable dental hx.    Pulmonary former smoker,    Pulmonary exam normal breath sounds clear to auscultation       Cardiovascular negative cardio ROS Normal cardiovascular exam Rhythm:Regular Rate:Normal     Neuro/Psych negative neurological ROS  negative psych ROS   GI/Hepatic negative GI ROS, Neg liver ROS,   Endo/Other  negative endocrine ROS  Renal/GU negative Renal ROS     Musculoskeletal negative musculoskeletal ROS (+)   Abdominal   Peds  Hematology negative hematology ROS (+)   Anesthesia Other Findings   Reproductive/Obstetrics                             Anesthesia Physical Anesthesia Plan  ASA: II  Anesthesia Plan: General   Post-op Pain Management:    Induction: Intravenous  PONV Risk Score and Plan:   Airway Management Planned: Oral ETT  Additional Equipment:   Intra-op Plan:   Post-operative Plan: Extubation in OR  Informed Consent: I have reviewed the patients History and Physical, chart, labs and discussed the procedure including the risks, benefits and alternatives for the proposed anesthesia with the patient or authorized representative who has indicated his/her understanding and acceptance.     Dental advisory given  Plan Discussed with: CRNA  Anesthesia Plan Comments:         Anesthesia Quick Evaluation

## 2019-04-05 NOTE — Op Note (Addendum)
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  Disc protrusion C6-7 with radiculopathy.  Postop diagnosis: Same  Procedure: C3-4 anterior cervical discectomy and fusion, allograft and plate.  C6-7 separate incision anterior cervical discectomy and fusion allograft and plate.  Surgeon: Rodell Perna, MD  Assistant: Benjiman Core, PA-C medically necessary for assistance and present for first half of the case.  April Fulp RNFA assisted last half of the case.  Anesthesia: General oral tracheal.  Drains 1 Hemovac neck  Implqants: skyline 14 mm plate times 2. 12 mm screws times 8.  6 and 52mm VG-2 corticocancellous allograft.  Procedure after induction general anesthesia orotracheal ovation wit wrist restraints applications had ultra traction neck was prepped with DuraPrep there is cord towel sterile skin marker implanted 2 incisions one over the 3 4 level based on palpable landmarks and the other C6-7.  Betadine Steri-Drape application sterile male standard the head and thyroid sheets and drapes.  Proximal C3-4 level was addressed surgically for since that level had myelopathic changes in the cord.  Blunt dissection down the multilevel longus Coley with some transverse superior thyroid artery on the left had to be coagulated.  Longus Coley was elevated short 25-gauge needle was placed with a straight clamp into the C3-4 disc space confirmed with lateral sterilely draped C arm and then discectomy was performed taking a chunk out of the disc for marking it.  Self-retaining retractors were placed with Cloward blades right and left smooth blades cephalad caudad.  Discectomy was performed progressing back with the bur back to the posterior longitudinal ligament where there was overhanging spurs 1 spurs were removed on amount of disc material was removed that was causing compression.  Trial sizers were used and 6 mm graft gave a nice tight fit at the C3-4 level.  A 38mm plate was  selected and 12 mm screws x4.  Plate was not locked and C-arm was used to check imaging to make sure plate and screws in good position.  Second incision was made over the C6-7 level progressing down to the prominent anterior spurs confirmed with a repeat image.  Spurs removed we progressed back to the posterior fourth the vertebral body.  There is 1 mm disc space and overhanging spurs that would contribute to spinal stenosis with disc material behind the spurs.  Complete decompression with visualization of the dura was performed.  Irrigation with saline solution.  Some Surgi-Flo was used for control of the bleeding surfaces bone wax anteriorly over the vertebral bodies remotely.  Trial sizers showed that 7 mm graft gait a good fit was countersunk 1 to 2 mm.  C-arm was brought in and confirmed good position of both plates and screws and graft.  All 8 screws were locked in with a tiny screwdriver.  Hemovac placed through separate stab incision with in and out technique passed from the inferior incision in the prevertebral space up to the upper incision.  Platysma closed with 3-0 Vicryl 4-0 Vicryl subarticular closure tincture benzoin Steri-Strips Marcaine infiltration 4 x 4's tape and soft collar was applied.  Aspen collar arrived in the room before the patient was extubated and we switched out the soft collar for the Aspen collar for an additional securement since patient had cord myelomalacia with cord injury.  Patient tolerated procedure well and transferred recovery room.

## 2019-04-06 ENCOUNTER — Encounter (HOSPITAL_COMMUNITY): Payer: Self-pay | Admitting: Orthopaedic Surgery

## 2019-04-06 DIAGNOSIS — M4802 Spinal stenosis, cervical region: Secondary | ICD-10-CM | POA: Diagnosis not present

## 2019-04-06 MED ORDER — OXYCODONE-ACETAMINOPHEN 5-325 MG PO TABS: 1.0000 | ORAL_TABLET | ORAL | 0 refills | Status: AC | PRN

## 2019-04-06 NOTE — Care Management Obs Status (Signed)
[facility] NOTIFICATION   Patient Details  Name: Troy Larsen MRN: [id number] Date of Birth: Aug 30, 1951   Medicare Observation Status Notification Given:  Yes    Ninfa Meeker, RN 04/06/2019, 10:07 AM

## 2019-04-06 NOTE — Progress Notes (Signed)
Patient discharging home. Discharge instructions explained to patient and he verbalized understanding. Took all personal belongings. No further questions or concerns voiced.  

## 2019-04-06 NOTE — Plan of Care (Signed)

## 2019-04-06 NOTE — Evaluation (Signed)
Occupational Therapy Evaluation and Discharge Patient Details Name: Troy Larsen MRN: [id number] DOB: May 13, 1951 Today's Date: 04/06/2019    History of Present Illness s/p ACDF C3-4, C6-7   Clinical Impression   Educated pt in cervical precautions, compensatory strategies for ADL and IADL, activities to avoid, walking program, proper positioning of cervical brace and fall prevention. Pt verbalized understanding of all information. No further OT needs.    Follow Up Recommendations  No OT follow up    Equipment Recommendations  None recommended by OT    Recommendations for Other Services       Precautions / Restrictions Precautions Precautions: Cervical Precaution Booklet Issued: Yes (comment) Precaution Comments: reviewed cervical precautions and reinforced with handout Required Braces or Orthoses: Cervical Brace Cervical Brace: Hard collar;At all times Restrictions Weight Bearing Restrictions: No Other Position/Activity Restrictions: instructed pt in proper positioning of cervical brace      Mobility Bed Mobility Overal bed mobility: Modified Independent             General bed mobility comments: instructed in log roll technique  Transfers Overall transfer level: Modified independent Equipment used: None                  Balance                                           ADL either performed or assessed with clinical judgement   ADL Overall ADL's : Modified independent                                       General ADL Comments: instructed in body mechanics, compensatory strategies for ADL and lifting precautions, advised to walk for exercise and in fall prevention     Vision Baseline Vision/History: Wears glasses Wears Glasses: Reading only Patient Visual Report: No change from baseline       Perception     Praxis      Pertinent Vitals/Pain Pain Assessment: Faces Faces Pain Scale: Hurts little more Pain  Location: incision Pain Descriptors / Indicators: Operative site guarding Pain Intervention(s): Monitored during session     Hand Dominance Right   Extremity/Trunk Assessment Upper Extremity Assessment Upper Extremity Assessment: Overall WFL for tasks assessed(tip of L thumb still numb)   Lower Extremity Assessment Lower Extremity Assessment: Overall WFL for tasks assessed       Communication Communication Communication: No difficulties   Cognition Arousal/Alertness: Awake/alert Behavior During Therapy: WFL for tasks assessed/performed Overall Cognitive Status: Within Functional Limits for tasks assessed                                     General Comments       Exercises     Shoulder Instructions      Home Living Family/patient expects to be discharged to:: Private residence Living Arrangements: Spouse/significant other;Children Available Help at Discharge: Family;Available 24 hours/day Type of Home: House Home Access: Stairs to enter   Entrance Stairs-Rails: Right Home Layout: One level               Home Equipment: None          Prior Functioning/Environment Level of Independence: Independent  OT Problem List:        OT Treatment/Interventions:      OT Goals(Current goals can be found in the care plan section) Acute Rehab OT Goals Patient Stated Goal: return home  OT Frequency:     Barriers to D/C:            Co-evaluation              AM-PAC OT "6 Clicks" Daily Activity     Outcome Measure Help from another person eating meals?: None Help from another person taking care of personal grooming?: None Help from another person toileting, which includes using toliet, bedpan, or urinal?: None Help from another person bathing (including washing, rinsing, drying)?: None Help from another person to put on and taking off regular upper body clothing?: None Help from another person to put on and taking off  regular lower body clothing?: None 6 Click Score: 24   End of Session Equipment Utilized During Treatment: Cervical collar Nurse Communication: Other (comment)(aware pt is still on clear liquid diet)  Activity Tolerance: Patient tolerated treatment well Patient left: in bed;with call bell/phone within reach  OT Visit Diagnosis: Pain                Time: [id number] OT Time Calculation (min): 29 min Charges:  OT General Charges $OT Visit: 1 Visit OT Evaluation $OT Eval Low Complexity: 1 Low OT Treatments $Self Care/Home Management : 8-22 mins  Martie RoundJulie Jarmarcus Wambold, OTR/L Acute Rehabilitation Services Pager: [phone] Office: [phone]  Evern BioMayberry, Ryelee Albee Lynn 04/06/2019, 10:44 AM

## 2019-04-06 NOTE — Progress Notes (Signed)
   Subjective: 1 Day Post-Op Procedure(s) (LRB): Cervical three-four, Cervical six-seven  ANTERIOR CERVICAL DECOMPRESSION/DISCECTOMY FUSION 2 LEVELS, ALLOGRAFT, PLATES (N/A) Patient reports pain as mild.    Objective: Vital signs in last 24 hours: Temp:  [97.2 F (36.2 C)-98.1 F (36.7 C)] 98.1 F (36.7 C) (07/02 0439) Pulse Rate:  [68-81] 74 (07/02 0439) Resp:  [8-18] 14 (07/02 0439) BP: (126-135)/(67-88) 131/88 (07/02 0439) SpO2:  [97 %-100 %] 98 % (07/02 0439)  Intake/Output from previous day: 07/01 0701 - 07/02 0700 In: 1300 [I.V.:1300] Out: 500 [Urine:400; Blood:100] Intake/Output this shift: No intake/output data recorded.  Recent Labs    04/05/19 1340  HGB 13.6   Recent Labs    04/05/19 1340  WBC 4.2  RBC 4.86  HCT 40.6  PLT 174   Recent Labs    04/05/19 1340  NA 139  K 3.4*  CL 106  CO2 25  BUN 14  CREATININE 0.95  GLUCOSE 98  CALCIUM 9.3   No results for input(s): LABPT, INR in the last 72 hours.  Neurologically intact Dg Cervical Spine 2-3 Views  Result Date: 04/05/2019 CLINICAL DATA:  Cervical fusion EXAM: CERVICAL SPINE - 2-3 VIEW; DG C-ARM 61-120 MIN COMPARISON:  11/29/2018 FLUOROSCOPY TIME:  Fluoroscopy Time:  14 seconds Radiation Exposure Index (if provided by the fluoroscopic device): Not available Number of Acquired Spot Images: 3 FINDINGS: There are changes consistent with interbody fusion at C3-4 and C6-7 with anterior fixation plates at both levels. No acute bony abnormality is noted. IMPRESSION: Cervical fusion as described above. Electronically Signed   By: Inez Catalina M.D.   On: 04/05/2019 20:26   Dg C-arm 1-60 Min  Result Date: 04/05/2019 CLINICAL DATA:  Cervical fusion EXAM: CERVICAL SPINE - 2-3 VIEW; DG C-ARM 61-120 MIN COMPARISON:  11/29/2018 FLUOROSCOPY TIME:  Fluoroscopy Time:  14 seconds Radiation Exposure Index (if provided by the fluoroscopic device): Not available Number of Acquired Spot Images: 3 FINDINGS: There are changes  consistent with interbody fusion at C3-4 and C6-7 with anterior fixation plates at both levels. No acute bony abnormality is noted. IMPRESSION: Cervical fusion as described above. Electronically Signed   By: Inez Catalina M.D.   On: 04/05/2019 20:26    Assessment/Plan: 1 Day Post-Op Procedure(s) (LRB): Cervical three-four, Cervical six-seven  ANTERIOR CERVICAL DECOMPRESSION/DISCECTOMY FUSION 2 LEVELS, ALLOGRAFT, PLATES (N/A) Plan:  Drain removed. D/c iv , discharge home. Office one week.   Marybelle Killings 04/06/2019, 8:15 AM

## 2019-04-06 NOTE — Care Management Obs Status (Signed)
[facility] NOTIFICATION   Patient Details  Name: Troy Larsen MRN: [id number] Date of Birth: 1951/08/07   Medicare Observation Status Notification Given:  Yes    Ninfa Meeker, RN 04/06/2019, 10:09 AM

## 2019-04-06 NOTE — Discharge Instructions (Signed)
Walk daily.  You will do better staying with liquids and soft food for a week or 2 since it will be easier to swallow.  He will do better if urine is sitting up in a recliner or beachchair position with less neck swelling.  Leave collar on at all times except when you take the soft collar wrapped with cellophane and apply it when you take a shower.  After shower dry your hair and body off then remove the soft collar and put the hard collar back on.  Yellow portion with open hole should be in the front.  See Dr. Lorin Mercy in 1 week.

## 2019-04-14 ENCOUNTER — Ambulatory Visit (INDEPENDENT_AMBULATORY_CARE_PROVIDER_SITE_OTHER): Payer: Medicare Other | Admitting: Orthopaedic Surgery

## 2019-04-14 ENCOUNTER — Encounter: Payer: Self-pay | Admitting: Orthopaedic Surgery

## 2019-04-14 ENCOUNTER — Ambulatory Visit: Payer: Self-pay

## 2019-04-14 ENCOUNTER — Other Ambulatory Visit: Payer: Self-pay

## 2019-04-14 VITALS — Ht 65.0 in | Wt 160.0 lb

## 2019-04-14 DIAGNOSIS — Z981 Arthrodesis status: Secondary | ICD-10-CM

## 2019-04-14 NOTE — Discharge Summary (Signed)
Patient ID: Troy Larsen MRN: [id number] DOB/AGE: 11-Mar-1951 68 y.o.  Admit date: 04/05/2019 Discharge date: 04/14/2019  Admission Diagnoses:  Active Problems:   Cervical stenosis of spine   Discharge Diagnoses:  Active Problems:   Cervical stenosis of spine  status post Procedure(s): Cervical three-four, Cervical six-seven  ANTERIOR CERVICAL DECOMPRESSION/DISCECTOMY FUSION 2 LEVELS, ALLOGRAFT, PLATES  History reviewed. No pertinent past medical history.  Surgeries: Procedure(s): Cervical three-four, Cervical six-seven  ANTERIOR CERVICAL DECOMPRESSION/DISCECTOMY FUSION 2 LEVELS, ALLOGRAFT, PLATES on 1/0/1751   Consultants:   Discharged Condition: Improved  Hospital Course: Troy Larsen is an 68 y.o. male who was admitted 04/05/2019 for operative treatment of cervical stenosis. Patient failed conservative treatments (please see the history and physical for the specifics) and had severe unremitting pain that affects sleep, daily activities and work/hobbies. After pre-op clearance, the patient was taken to the operating room on 04/05/2019 and underwent  Procedure(s): Cervical three-four, Cervical six-seven  ANTERIOR CERVICAL DECOMPRESSION/DISCECTOMY FUSION 2 LEVELS, ALLOGRAFT, PLATES.    Patient was given perioperative antibiotics:  Anti-infectives (From admission, onward)   Start     Dose/Rate Route Frequency Ordered Stop   04/06/19 0200  ceFAZolin (ANCEF) IVPB 1 g/50 mL premix  Status:  Discontinued     1 g 100 mL/hr over 30 Minutes Intravenous Every 8 hours 04/05/19 2200 04/06/19 1544   04/05/19 1500  ceFAZolin (ANCEF) IVPB 2g/100 mL premix     2 g 200 mL/hr over 30 Minutes Intravenous On call to O.R. 04/05/19 1318 04/05/19 1756   04/05/19 1321  ceFAZolin (ANCEF) 2-4 GM/100ML-% IVPB    Note to Pharmacy: Lorne Skeens   : cabinet override      04/05/19 1321 04/05/19 1756       Patient was given sequential compression devices and early ambulation to prevent DVT.   Patient  benefited maximally from hospital stay and there were no complications. At the time of discharge, the patient was urinating/moving their bowels without difficulty, tolerating a regular diet, pain is controlled with oral pain medications and they have been cleared by PT/OT.   Recent vital signs: No data found.   Recent laboratory studies: No results for input(s): WBC, HGB, HCT, PLT, NA, K, CL, CO2, BUN, CREATININE, GLUCOSE, INR, CALCIUM in the last 72 hours.  Invalid input(s): PT, 2   Discharge Medications:   Allergies as of 04/06/2019   No Known Allergies     Medication List    STOP taking these medications   celecoxib 200 MG capsule Commonly known as: CELEBREX   ibuprofen 200 MG tablet Commonly known as: ADVIL   methocarbamol 500 MG tablet Commonly known as: ROBAXIN   predniSONE 5 MG (21) Tbpk tablet Commonly known as: STERAPRED UNI-PAK 21 TAB   psyllium 58.6 % packet Commonly known as: METAMUCIL     TAKE these medications   oxyCODONE-acetaminophen 5-325 MG tablet Commonly known as: Percocet Take 1 tablet by mouth every 4 (four) hours as needed for severe pain.       Diagnostic Studies: Dg Cervical Spine 2-3 Views  Result Date: 04/05/2019 CLINICAL DATA:  Cervical fusion EXAM: CERVICAL SPINE - 2-3 VIEW; DG C-ARM 61-120 MIN COMPARISON:  11/29/2018 FLUOROSCOPY TIME:  Fluoroscopy Time:  14 seconds Radiation Exposure Index (if provided by the fluoroscopic device): Not available Number of Acquired Spot Images: 3 FINDINGS: There are changes consistent with interbody fusion at C3-4 and C6-7 with anterior fixation plates at both levels. No acute bony abnormality is noted. IMPRESSION: Cervical fusion as described above.  Electronically Signed   By: Alcide CleverMark  Lukens M.D.   On: 04/05/2019 20:26   Dg C-arm 1-60 Min  Result Date: 04/05/2019 CLINICAL DATA:  Cervical fusion EXAM: CERVICAL SPINE - 2-3 VIEW; DG C-ARM 61-120 MIN COMPARISON:  11/29/2018 FLUOROSCOPY TIME:  Fluoroscopy Time:  14  seconds Radiation Exposure Index (if provided by the fluoroscopic device): Not available Number of Acquired Spot Images: 3 FINDINGS: There are changes consistent with interbody fusion at C3-4 and C6-7 with anterior fixation plates at both levels. No acute bony abnormality is noted. IMPRESSION: Cervical fusion as described above. Electronically Signed   By: Alcide CleverMark  Lukens M.D.   On: 04/05/2019 20:26    Discharge Instructions    Incentive spirometry RT   Complete by: As directed       Follow-up Information    Eldred MangesYates, Mark C, MD Follow up in 1 week(s).   Specialty: Orthopedic Surgery Contact information: [address] [place] [address] [phone]           Discharge Plan:  discharge to home  Disposition:     Signed: Zonia KiefJames Tanecia Mccay for Annell GreeningMark Yates MD 04/14/2019, 10:54 AM

## 2019-04-14 NOTE — Progress Notes (Signed)
   Post-Op Visit Note   Patient: Troy Larsen           Date of Birth: 1951/03/19           MRN: [id number] Visit Date: 04/14/2019 PCP: Jilda Panda, MD   Assessment & Plan: Post two-level cervical discectomy at C3-4 and then down at C6-7.  Good relief of arm pain which is at the C6-7 level.  He had some cord myelomalacia at C3-4 without significant myelopathy.  Steri-Strips changed x-rays look good.  Return 5 weeks for flexion-extension x-ray lateral and AP x-ray out of his collar.  He continues to have a little bit of problems with dysphasia as expected with 2 separate incisions.  He has noticed this is improved over the last 3 days.  Chief Complaint:  Chief Complaint  Patient presents with  . Neck - Routine Post Op    04/05/2019 C3-4, C6-7 ACDF, Allograft, Plate   Visit Diagnoses:  1. Status post cervical spinal fusion     Plan: Return 5 weeks lateral flexion-extension x-rays out of collar and AP x-ray.  Follow-Up Instructions: No follow-ups on file.   Orders:  Orders Placed This Encounter  Procedures  . XR Cervical Spine 2 or 3 views   No orders of the defined types were placed in this encounter.   Imaging: No results found.  PMFS History: Patient Active Problem List   Diagnosis Date Noted  . Cervical stenosis of spine 04/05/2019  . Impingement syndrome of left shoulder 03/31/2019  . Cervical cord myelomalacia ([place]) 01/21/2019  . Spinal stenosis of cervical region 01/21/2019  . Protrusion of cervical intervertebral disc 01/21/2019   No past medical history on file.  No family history on file.  Past Surgical History:  Procedure Laterality Date  . ANTERIOR CERVICAL DECOMP/DISCECTOMY FUSION N/A 04/05/2019   Procedure: Cervical three-four, Cervical six-seven  ANTERIOR CERVICAL DECOMPRESSION/DISCECTOMY FUSION 2 LEVELS, ALLOGRAFT, PLATES;  Surgeon: Marybelle Killings, MD;  Location: [facility];  Service: Orthopedics;  Laterality: N/A;  . COLONOSCOPY    . THYROIDECTOMY     Social  History   Occupational History  . Not on file  Tobacco Use  . Smoking status: Former Research scientist (life sciences)  . Smokeless tobacco: Never Used  . Tobacco comment: 33 years ago  Substance and Sexual Activity  . Alcohol use: Yes    Alcohol/week: 2.0 standard drinks    Types: 2 Cans of beer per week  . Drug use: Never  . Sexual activity: Not on file

## 2019-05-09 ENCOUNTER — Telehealth: Payer: Self-pay

## 2019-05-09 NOTE — Telephone Encounter (Signed)
Please advise 

## 2019-05-09 NOTE — Telephone Encounter (Signed)
After his next ROV if xrays look good. Should be at 6 wks post op thanks

## 2019-05-09 NOTE — Telephone Encounter (Signed)
Dr. Arita Miss Dental office would like to know when would patient be able to have his tooth extracted?  Patient is S/P Cervical Spinal Fusion on 04/05/2019.  Cb# is [phone].  Please advise.  Thank you.

## 2019-05-10 NOTE — Telephone Encounter (Signed)
I called and advised. Dental office will be faxing release to office.

## 2019-05-19 ENCOUNTER — Encounter: Payer: Self-pay | Admitting: Orthopaedic Surgery

## 2019-05-19 ENCOUNTER — Ambulatory Visit (INDEPENDENT_AMBULATORY_CARE_PROVIDER_SITE_OTHER): Payer: Medicare Other | Admitting: Orthopaedic Surgery

## 2019-05-19 ENCOUNTER — Ambulatory Visit (INDEPENDENT_AMBULATORY_CARE_PROVIDER_SITE_OTHER): Payer: Medicare Other

## 2019-05-19 VITALS — BP 137/91 | HR 80 | Ht 65.0 in | Wt 160.0 lb

## 2019-05-19 DIAGNOSIS — Z981 Arthrodesis status: Secondary | ICD-10-CM

## 2019-05-19 NOTE — Progress Notes (Signed)
   Office Visit Note   Patient: Troy Larsen           Date of Birth: 23-Dec-1950           MRN: [id number] Visit Date: 05/19/2019              Requested by: Jilda Panda, MD [address],  [place] [address] PCP: Jilda Panda, MD   Assessment & Plan: Visit Diagnoses:  1. Status post cervical spinal fusion     Plan: Steri-Strips removed.  He is in a hard collar but can switch to a soft collar that he has at home.  Continue collar till return in 5 weeks and we will obtain lateral C-spine flexion-extension x-rays on return.  Good relief her preop left arm pain.  He backed off the oxycodone since it was making him nauseated.  Follow-Up Instructions: Return in about 3 weeks (around 06/09/2019).   Orders:  Orders Placed This Encounter  Procedures  . XR Cervical Spine 2 or 3 views   No orders of the defined types were placed in this encounter.     Procedures: No procedures performed   Clinical Data: No additional findings.   Subjective: Chief Complaint  Patient presents with  . Neck - Follow-up    04/05/2019 C3-4, C6-7 ACDF, Fusion 2 levels, Allograft, Plates    HPI  Review of Systems   Objective: Vital Signs: BP (!) 137/91   Pulse 80   Ht 5\' 5"  (1.651 m)   Wt 160 lb (72.6 kg)   BMI 26.63 kg/m   Physical Exam  Ortho Exam  Specialty Comments:  No specialty comments available.  Imaging: No results found.   PMFS History: Patient Active Problem List   Diagnosis Date Noted  . Cervical stenosis of spine 04/05/2019  . Impingement syndrome of left shoulder 03/31/2019  . Cervical cord myelomalacia ([place]) 01/21/2019  . Spinal stenosis of cervical region 01/21/2019  . Protrusion of cervical intervertebral disc 01/21/2019   No past medical history on file.  No family history on file.  Past Surgical History:  Procedure Laterality Date  . ANTERIOR CERVICAL DECOMP/DISCECTOMY FUSION N/A 04/05/2019   Procedure: Cervical three-four, Cervical six-seven  ANTERIOR  CERVICAL DECOMPRESSION/DISCECTOMY FUSION 2 LEVELS, ALLOGRAFT, PLATES;  Surgeon: Marybelle Killings, MD;  Location: [facility];  Service: Orthopedics;  Laterality: N/A;  . COLONOSCOPY    . THYROIDECTOMY     Social History   Occupational History  . Not on file  Tobacco Use  . Smoking status: Former Research scientist (life sciences)  . Smokeless tobacco: Never Used  . Tobacco comment: 33 years ago  Substance and Sexual Activity  . Alcohol use: Yes    Alcohol/week: 2.0 standard drinks    Types: 2 Cans of beer per week  . Drug use: Never  . Sexual activity: Not on file

## 2019-06-13 ENCOUNTER — Encounter: Payer: Self-pay | Admitting: Orthopaedic Surgery

## 2019-06-13 ENCOUNTER — Other Ambulatory Visit: Payer: Self-pay

## 2019-06-13 ENCOUNTER — Ambulatory Visit (INDEPENDENT_AMBULATORY_CARE_PROVIDER_SITE_OTHER): Payer: Medicare Other | Admitting: Orthopaedic Surgery

## 2019-06-13 ENCOUNTER — Ambulatory Visit (INDEPENDENT_AMBULATORY_CARE_PROVIDER_SITE_OTHER): Payer: Medicare Other

## 2019-06-13 VITALS — BP 144/94 | HR 72 | Ht 65.0 in | Wt 160.0 lb

## 2019-06-13 DIAGNOSIS — M25512 Pain in left shoulder: Secondary | ICD-10-CM

## 2019-06-13 DIAGNOSIS — M7542 Impingement syndrome of left shoulder: Secondary | ICD-10-CM

## 2019-06-13 DIAGNOSIS — G9589 Other specified diseases of spinal cord: Secondary | ICD-10-CM

## 2019-06-13 DIAGNOSIS — Z981 Arthrodesis status: Secondary | ICD-10-CM | POA: Insufficient documentation

## 2019-06-13 DIAGNOSIS — G8929 Other chronic pain: Secondary | ICD-10-CM

## 2019-06-13 NOTE — Progress Notes (Signed)
   Post-Op Visit Note   Patient: Troy Larsen           Date of Birth: 1951/08/11           MRN: [id number] Visit Date: 06/13/2019 PCP: Jilda Panda, MD   Assessment & Plan:  Chief Complaint:  Chief Complaint  Patient presents with  . Neck - Follow-up    04/05/2019 C3-4, C6-7 ACDF, Allograft, Plate   Visit Diagnoses:  1. Status post cervical spinal fusion   2. Impingement syndrome of left shoulder   3. 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 S/P cervical spinal fusion     Plan: Patient continues to have pain in his left shoulder positive impingement left shoulder and had myelomalacia at C3-4 level worse on the left than right of his cord which may be responsible for shoulder weakness and shoulder pain.  He does have impingement he has had a previous injection in his subacromial region left shoulder without sustained relief.  He may have an accompanying rotator cuff tear or this weakness may be related to the cord damage she had with cord myelomalacia that was present due to disc protrusion which is now been surgically stabilized with the cervical fusion.  He is going back to work activities on Friday.  He can discontinue collar and will obtain an MRI scan of his shoulder due to his persistent week with with positive impingement with possibility of rotator cuff tear.  He has had left shoulder symptoms for greater than a year taken anti-inflammatories without relief exercise program for shoulder and subacromial injection in his shoulder all without sustained relief.  Follow-Up Instructions: Follow-up after left shoulder MRI scan  Orders:  Orders Placed This Encounter  Procedures  . XR Cervical Spine 2 or 3 views   No orders of the defined types were placed in this encounter.   Imaging: No results found.  PMFS History: Patient Active Problem List   Diagnosis Date Noted  . S/P cervical spinal fusion 06/13/2019  . Cervical stenosis of spine 04/05/2019  . Impingement syndrome of left  shoulder 03/31/2019  . 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  No family history on file.  Past Surgical History:  Procedure Laterality Date  . ANTERIOR CERVICAL DECOMP/DISCECTOMY FUSION N/A 04/05/2019   Procedure: Cervical three-four, Cervical six-seven  ANTERIOR CERVICAL DECOMPRESSION/DISCECTOMY FUSION 2 LEVELS, ALLOGRAFT, PLATES;  Surgeon: Marybelle Killings, MD;  Location: [facility];  Service: Orthopedics;  Laterality: N/A;  . COLONOSCOPY    . THYROIDECTOMY     Social History   Occupational History  . Not on file  Tobacco Use  . Smoking status: Former Research scientist (life sciences)  . Smokeless tobacco: Never Used  . Tobacco comment: 33 years ago  Substance and Sexual Activity  . Alcohol use: Yes    Alcohol/week: 2.0 standard drinks    Types: 2 Cans of beer per week  . Drug use: Never  . Sexual activity: Not on file

## 2019-06-13 NOTE — Addendum Note (Signed)
Addended by: Meyer Cory on: 06/13/2019 02:48 PM   Modules accepted: Orders

## 2019-06-22 ENCOUNTER — Telehealth: Payer: Self-pay | Admitting: Orthopaedic Surgery

## 2019-06-22 NOTE — Telephone Encounter (Signed)
I left voicemail for Olin Hauser to return my call.

## 2019-06-22 NOTE — Telephone Encounter (Signed)
Olin Hauser with [facility] called. She needs some clarification on orders for patient. Her call back number is [phone]. Thanks

## 2019-06-22 NOTE — Telephone Encounter (Signed)
Troy Larsen, I believe this message is for you.  It was sent to me in error.

## 2019-06-23 NOTE — Telephone Encounter (Signed)
I left voicemail for Troy Larsen x 2. Will wait for return call.

## 2019-06-28 ENCOUNTER — Other Ambulatory Visit: Payer: Self-pay | Admitting: Emergency Medicine

## 2019-06-28 DIAGNOSIS — Z20822 Contact with and (suspected) exposure to covid-19: Secondary | ICD-10-CM

## 2019-06-30 LAB — NOVEL CORONAVIRUS, NAA: SARS-CoV-2, NAA: NOT DETECTED

## 2019-07-10 ENCOUNTER — Other Ambulatory Visit: Payer: Self-pay

## 2019-07-10 ENCOUNTER — Ambulatory Visit
Admission: RE | Admit: 2019-07-10 | Discharge: 2019-07-10 | Disposition: A | Discharge status: Home / Self Care | Payer: Medicare Other | Source: Ambulatory Visit | Attending: Orthopaedic Surgery | Admitting: Orthopaedic Surgery

## 2019-07-10 DIAGNOSIS — M7542 Impingement syndrome of left shoulder: Secondary | ICD-10-CM

## 2019-07-10 DIAGNOSIS — G8929 Other chronic pain: Secondary | ICD-10-CM

## 2019-07-13 ENCOUNTER — Ambulatory Visit (INDEPENDENT_AMBULATORY_CARE_PROVIDER_SITE_OTHER): Payer: Medicare Other | Admitting: Surgery

## 2019-07-13 ENCOUNTER — Encounter: Payer: Self-pay | Admitting: Surgery

## 2019-07-13 VITALS — BP 119/75 | HR 66 | Ht 65.0 in | Wt 160.0 lb

## 2019-07-13 DIAGNOSIS — M7542 Impingement syndrome of left shoulder: Secondary | ICD-10-CM

## 2019-07-13 NOTE — Progress Notes (Signed)
68 year old male history of left shoulder pain comes in for review of his MRI.  Scan showed:  CLINICAL DATA:  Left shoulder pain, decreased range of motion  EXAM: MRI OF THE LEFT SHOULDER WITHOUT CONTRAST  TECHNIQUE: Multiplanar, multisequence MR imaging of the shoulder was performed. No intravenous contrast was administered.  COMPARISON:  None.  FINDINGS: Rotator cuff: Moderate tendinosis of the supraspinatus tendon. Mild tendinosis of the infraspinatus tendon with a small partial-thickness articular surface tear. Teres minor tendon is intact. Subscapularis tendon is intact.  Muscles: No atrophy or fatty replacement of nor abnormal signal within, the muscles of the rotator cuff.  Biceps long head: Severe tendinosis of the intra-articular portion of the long head of the biceps tendon.  Acromioclavicular Joint: Mild arthropathy of the acromioclavicular joint. Type I acromion. Trace subacromial/subdeltoid bursal fluid.  Glenohumeral Joint: No joint effusion.  No chondral defect.  Labrum: Limited evaluation secondary lack of intra-articular fluid. Posterior labral tear.  Bones:  No acute osseous abnormality.  No aggressive osseous lesion  Other: No fluid collection or hematoma  IMPRESSION: 1. Moderate tendinosis of the supraspinatus tendon. 2. Mild tendinosis of the infraspinatus tendon with a small partial-thickness articular surface tear. 3. Severe tendinosis of the intra-articular portion of the long head of the biceps tendon.   He has done very well from his cervical fusion and is very pleased with his result.  He continues to have debilitating pain throughout his left shoulder and feels like he is losing range of motion with overhead activities.  She has seen Dr. Marlou Sa in the past for shoulder and then was referred to Dr. Lorin Mercy for neck issues.  Has had subacromial Marcaine/Depo-Medrol injection and patient states that this gave some temporary  relief.    Exam Very pleasant male alert and oriented in no acute distress.  Left shoulder passive flexion/abduction to about 130 degrees with marked discomfort.  Passively I can get him to about 150-160 degrees again with pain.  Positive impingement test.  He is tender over the proximal biceps tendon.  Negative drop arm test.  Plan Reviewed MRI with patient today.  I will schedule an appointment to see Dr. Marlou Sa next week to discuss treatment options.  Advised patient that if surgical intervention is needed it would possibly be shoulder arthroscopy with debridement possible rotator cuff repair, possible long head biceps tenolysis/tenodesis.  I told him with his he may not need a tenodesis with Dr. Marlou Sa will discuss that with him obviously in more detail.  All questions answered.  I will have him follow-up with Dr. Lorin Mercy in 6 weeks for recheck of his cervical fusion.

## 2019-07-20 ENCOUNTER — Other Ambulatory Visit: Payer: Self-pay

## 2019-07-20 ENCOUNTER — Ambulatory Visit (INDEPENDENT_AMBULATORY_CARE_PROVIDER_SITE_OTHER): Payer: Medicare Other | Admitting: Orthopedic Surgery

## 2019-07-20 DIAGNOSIS — M7502 Adhesive capsulitis of left shoulder: Secondary | ICD-10-CM

## 2019-07-21 ENCOUNTER — Encounter: Payer: Self-pay | Admitting: Orthopedic Surgery

## 2019-07-21 DIAGNOSIS — M7502 Adhesive capsulitis of left shoulder: Secondary | ICD-10-CM | POA: Diagnosis not present

## 2019-07-21 MED ORDER — BUPIVACAINE HCL 0.5 % IJ SOLN
9.0000 mL | INTRAMUSCULAR | Status: AC | PRN
  Administered 2019-07-21: 9 mL via INTRA_ARTICULAR

## 2019-07-21 MED ORDER — LIDOCAINE HCL 1 % IJ SOLN
5.0000 mL | INTRAMUSCULAR | Status: AC | PRN
  Administered 2019-07-21: 23:00:00 5 mL

## 2019-07-21 MED ORDER — METHYLPREDNISOLONE ACETATE 40 MG/ML IJ SUSP
40.0000 mg | INTRAMUSCULAR | Status: AC | PRN
  Administered 2019-07-21: 23:00:00 40 mg via INTRA_ARTICULAR

## 2019-07-21 NOTE — Progress Notes (Signed)
Office Visit Note   Patient: Troy Larsen           Date of Birth: 28-Jan-1951           MRN: [id number] Visit Date: 07/20/2019 Requested by: Jilda Panda, MD [address] [place],  [place] [address] PCP: Jilda Panda, MD  Subjective: Chief Complaint  Patient presents with   Left Shoulder - Pain    HPI: Troy Larsen is a 68 y.o. male who presents to the office complaining of left shoulder pain.  Patient notes 5 years of left shoulder pain with worsening function in the last year.  He denies any specific injury.  Has been taking Advil and Celebrex alternating between the 2.  He notes stiffness of the left shoulder but denies any weakness.  He cannot sleep on his left side.  Pain is worse with overhead motion.  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  He has no history of left shoulder surgery.  He had a left shoulder MRI on 07/10/2019 that showed severe tendinosis of the long head of the biceps, moderate tendinosis of the supraspinatus, small partial-thickness articular tear of the infraspinatus, as well as adhesive capsulitis of the left shoulder.  With thickening in the axillary capsular area.  Patient has a history of thyroidectomy but no diabetes.  Denies any radicular symptoms.              ROS:  All systems reviewed are negative as they relate to the chief complaint within the history of present illness.  Patient denies fevers or chills.  Assessment & Plan: Visit Diagnoses:  1. Adhesive capsulitis of left shoulder     Plan: Patient is a 68 year old male who presents with 1 year history of decreasing shoulder function and increasing pain.  Patient denies any injury.  On exam he has excellent strength but stiffness of the left shoulder when compared with the contralateral side.  He has functional range of motion but he does have limits with external rotation and internal rotation primarily.  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  Impression is adhesive capsulitis of the left shoulder.  Recommended left shoulder joint injection with home exercise program.  Patient agreed with this plan and tolerated the procedure well.  Specific exercises were prescribed to the patient and explained in detail.  Recommended that he buy a shoulder pulley as well.  Patient agreed and will follow up in 8 weeks for clinical recheck.  And decision for or against operative intervention at that time consisting of manipulation with rotator interval release and biceps tenodesis.  Follow-Up Instructions: No follow-ups on file.   Orders:  No orders of the defined types were placed in this encounter.  No orders of the defined types were placed in this encounter.     Procedures: Large Joint Inj: L glenohumeral on 07/21/2019 10:50 PM Indications: diagnostic evaluation and pain Details: 18 G 1.5 in needle, posterior approach  Arthrogram: No  Medications: 9 mL bupivacaine 0.5 %; 40 mg methylPREDNISolone acetate 40 MG/ML; 5 mL lidocaine 1 % Outcome: tolerated well, no immediate complications Procedure, treatment alternatives, risks and benefits explained, specific risks discussed. Consent was given by the patient. Immediately prior to procedure a time out was called to verify the correct patient, procedure, equipment, support staff and site/side marked as required. Patient was prepped and draped in  the usual sterile fashion.       Clinical Data: No additional findings.  Objective: Vital Signs: There were no vitals taken for this visit.  Physical Exam:  Constitutional: Patient appears well-developed HEENT:  Head: Normocephalic Eyes:EOM are normal Neck: Normal range of motion Cardiovascular: Normal rate Pulmonary/chest: Effort normal Neurologic: Patient is alert Skin: Skin is warm Psychiatric: Patient has normal mood and  affect  Ortho Exam:  Left shoulder Exam Able to forward flex and abduct shoulder overhead Good endpoint with ER Decrease of passive external rotation, internal rotation when compared with contralateral side.  Slight decreased to forward flexion and abduction when compared to the contralateral side. No TTP over the [facility] joint  Mild TTP over the bicipital groove Good subscapularis, supraspinatus, and infraspinatus strength.  Pain elicited with supraspinatus and infraspinatus resistance testing 5/5 grip strength, forearm pronation/supination, and bicep strength  Specialty Comments:  No specialty comments available.  Imaging: No results found.   PMFS History: Patient Active Problem List   Diagnosis Date Noted   S/P cervical spinal fusion 06/13/2019   Cervical stenosis of spine 04/05/2019   Impingement syndrome of left shoulder 03/31/2019   Cervical cord myelomalacia ([facility]) 01/21/2019   No past medical history on file.  No family history on file.  Past Surgical History:  Procedure Laterality Date   ANTERIOR CERVICAL DECOMP/DISCECTOMY FUSION N/A 04/05/2019   Procedure: Cervical three-four, Cervical six-seven  ANTERIOR CERVICAL DECOMPRESSION/DISCECTOMY FUSION 2 LEVELS, ALLOGRAFT, PLATES;  Surgeon: Eldred Manges, MD;  Location: [facility];  Service: Orthopedics;  Laterality: N/A;   COLONOSCOPY     THYROIDECTOMY     Social History   Occupational History   Not on file  Tobacco Use   Smoking status: Former Smoker   Smokeless tobacco: Never Used   Tobacco comment: 33 years ago  Substance and Sexual Activity   Alcohol use: Yes    Alcohol/week: 2.0 standard drinks    Types: 2 Cans of beer per week   Drug use: Never   Sexual activity: Not on file

## 2019-08-29 ENCOUNTER — Ambulatory Visit: Payer: Medicare Other | Admitting: Orthopaedic Surgery

## 2019-09-05 ENCOUNTER — Ambulatory Visit (INDEPENDENT_AMBULATORY_CARE_PROVIDER_SITE_OTHER): Payer: Medicare Other | Admitting: Orthopaedic Surgery

## 2019-09-05 ENCOUNTER — Encounter: Payer: Self-pay | Admitting: Orthopaedic Surgery

## 2019-09-05 ENCOUNTER — Other Ambulatory Visit: Payer: Self-pay

## 2019-09-05 VITALS — Ht 65.0 in | Wt 160.0 lb

## 2019-09-05 DIAGNOSIS — Z981 Arthrodesis status: Secondary | ICD-10-CM

## 2019-09-05 DIAGNOSIS — G9589 Other specified diseases of spinal cord: Secondary | ICD-10-CM | POA: Diagnosis not present

## 2019-09-05 NOTE — Progress Notes (Signed)
Office Visit Note   Patient: Troy Larsen           Date of Birth: 03-12-1951           MRN: [id number] Visit Date: 09/05/2019              Requested by: Ralene Ok, MD [address] [facility] [address] [place],  [place] [address] PCP: Ralene Ok, MD   Assessment & Plan: Visit Diagnoses:  1. S/P cervical spinal fusion   2. Cervical cord myelomalacia ([facility])     Plan: Reviewed his MRI scan preop we had myelopathic changes in the cord with stenosis at C3-4.  No motion on last x-rays obtained in September which we reviewed.  He is released for all activities is happy with the results of treatment and since the injection and home exercise program instructed by Dr. August Saucer he states his shoulder is doing much better as well.  Follow-up with me will be on an as-needed basis.  Follow-Up Instructions: Return if symptoms worsen or fail to improve.   Orders:  No orders of the defined types were placed in this encounter.  No orders of the defined types were placed in this encounter.     Procedures: No procedures performed   Clinical Data: No additional findings.   Subjective: Chief Complaint  Patient presents with  . Neck - Follow-up    04/05/2019 C3-4, C6-7 ACDF, Allograft, Plate    HPI 68 year old male returns post C3-4 cervical fusion for cord myelomalacia and stenosis and C6-7 for disc protrusion both done on 04/05/2019 through separate incisions.  Patient states his neck feels fine is much better tingling in his hands is better.  He had to have a cortisone injection due to some synovitis in his shoulder but now can get his arm up over his head.  He is ready to go back down to [employer] for training this coming weekend.  He is happy with his results of his neck surgery.  Review of Systems 14 point view of systems updated unchanged.   Objective: Vital Signs: Ht 5\' 5"  (1.651 m)   Wt 160 lb (72.6 kg)   BMI 26.63 kg/m   Physical Exam Constitutional:      Appearance: He is well-developed.  HENT:      Head: Normocephalic and atraumatic.  Eyes:     Pupils: Pupils are equal, round, and reactive to light.  Neck:     Thyroid: No thyromegaly.     Trachea: No tracheal deviation.  Cardiovascular:     Rate and Rhythm: Normal rate.  Pulmonary:     Effort: Pulmonary effort is normal.     Breath sounds: No wheezing.  Abdominal:     General: Bowel sounds are normal.     Palpations: Abdomen is soft.  Skin:    General: Skin is warm and dry.     Capillary Refill: Capillary refill takes less than 2 seconds.  Neurological:     Mental Status: He is alert and oriented to person, place, and time.  Psychiatric:        Behavior: Behavior normal.        Thought Content: Thought content normal.        Judgment: Judgment normal.     Ortho Exam patient has good range of motion cervical spine.  Cervical incisions are well-healed.  Good rotation extension and tilting is nonpainful.  No myelopathic gait problems.  Specialty Comments:  No specialty comments available.  Imaging: No results found.  PMFS History: Patient Active Problem List   Diagnosis Date Noted  . S/P cervical spinal fusion 06/13/2019  . Impingement syndrome of left shoulder 03/31/2019  . 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  No family history on file.  Past Surgical History:  Procedure Laterality Date  . ANTERIOR CERVICAL DECOMP/DISCECTOMY FUSION N/A 04/05/2019   Procedure: Cervical three-four, Cervical six-seven  ANTERIOR CERVICAL DECOMPRESSION/DISCECTOMY FUSION 2 LEVELS, ALLOGRAFT, PLATES;  Surgeon: Marybelle Killings, MD;  Location: [facility];  Service: Orthopedics;  Laterality: N/A;  . COLONOSCOPY    . THYROIDECTOMY     Social History   Occupational History  . Not on file  Tobacco Use  . Smoking status: Former Research scientist (life sciences)  . Smokeless tobacco: Never Used  . Tobacco comment: 33 years ago  Substance and Sexual Activity  . Alcohol use: Yes    Alcohol/week: 2.0 standard drinks     Types: 2 Cans of beer per week  . Drug use: Never  . Sexual activity: Not on file

## 2019-09-20 ENCOUNTER — Ambulatory Visit: Payer: Medicare Other | Admitting: Orthopedic Surgery

## 2019-09-27 ENCOUNTER — Encounter: Payer: Self-pay | Admitting: Orthopedic Surgery

## 2019-09-27 ENCOUNTER — Other Ambulatory Visit: Payer: Self-pay

## 2019-09-27 ENCOUNTER — Ambulatory Visit (INDEPENDENT_AMBULATORY_CARE_PROVIDER_SITE_OTHER): Payer: Medicare Other | Admitting: Orthopedic Surgery

## 2019-09-27 DIAGNOSIS — M7542 Impingement syndrome of left shoulder: Secondary | ICD-10-CM

## 2019-09-27 DIAGNOSIS — M7502 Adhesive capsulitis of left shoulder: Secondary | ICD-10-CM | POA: Diagnosis not present

## 2019-09-30 ENCOUNTER — Encounter: Payer: Self-pay | Admitting: Orthopedic Surgery

## 2019-09-30 NOTE — Progress Notes (Signed)
Office Visit Note   Patient: Troy Larsen           Date of Birth: 08/21/1951           MRN: [id number] Visit Date: 09/27/2019 Requested by: Ralene Ok, MD [address] [facility] [address] [place],  [place] [address] PCP: Ralene Ok, MD  Subjective: Chief Complaint  Patient presents with  . Left Shoulder - Follow-up    HPI: Troy Larsen is a 68 y.o. male who presents to the office complaining of left shoulder pain.  He returns to the office following left shoulder injection on 07/20/2019 for left frozen shoulder.  Patient notes that his range of motion is significantly improved but he notes that his pain is persisting.  Cannot sleep on the left side.  He takes Celebrex occasionally for pain.  He localizes pain to the lateral aspect of his left shoulder.  Denies any neck pain or acute radicular symptoms or new onset numbness/tingling.  He does have radicular symptoms from the neck into the left thumb, index, middle finger but this has been present since prior to his surgery on his neck.  He works as a Surveyor, minerals..                ROS:  All systems reviewed are negative as they relate to the chief complaint within the history of present illness.  Patient denies fevers or chills.  Assessment & Plan: Visit Diagnoses:  1. Adhesive capsulitis of left shoulder   2. Impingement syndrome of left shoulder     Plan: Patient is a 68 year old male who presents following left shoulder injection for frozen shoulder about 2 months ago.  His range of motion is significantly improved but he notes continued pain in the left shoulder that has not improved nearly as much.  His range of motion is definitely improved at 90 degrees of abduction, 105 degrees of forward flexion, about 30 degrees of external rotation.  He does not have any weakness on exam but does have pain elicited by impingement signs.  He has had prior MRI that also showed signs of impingement.  He works as a Surveyor, [employer] and states that he is going to be very busy  over the next couple months so he does not wish to proceed with any sort of surgical management.  I agreed with this plan and encourage patient to follow-up in 2 months for another left shoulder injection.  Patient agreed with this plan.  If this shoulder injection does not improve patient's pain significantly, we will consider surgical management in late 2021 for patient's impingement symptoms.  Follow-Up Instructions: No follow-ups on file.   Orders:  No orders of the defined types were placed in this encounter.  No orders of the defined types were placed in this encounter.     Procedures: No procedures performed   Clinical Data: No additional findings.  Objective: Vital Signs: There were no vitals taken for this visit.  Physical Exam:  Constitutional: Patient appears well-developed HEENT:  Head: Normocephalic Eyes:EOM are normal Neck: Normal range of motion Cardiovascular: Normal rate Pulmonary/chest: Effort normal Neurologic: Patient is alert Skin: Skin is warm Psychiatric: Patient has normal mood and affect  Ortho Exam:  Left shoulder Exam 90 degrees abduction 105 degrees of forward flexion 30 degrees external rotation which is near equivalent to the contralateral side No TTP over the [facility] joint or bicipital groove 5/5 motor strength of the subscapularis, supraspinatus, and infraspinatus muscles Positive Hawkins impingement.  Positive Neer's impingement 5/5 grip  strength, forearm pronation/supination, and bicep strength  Specialty Comments:  No specialty comments available.  Imaging: No results found.   PMFS History: Patient Active Problem List   Diagnosis Date Noted  . S/P cervical spinal fusion 06/13/2019  . Impingement syndrome of left shoulder 03/31/2019  . 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 No pertinent past medical history.  History reviewed. No pertinent family history.  Past Surgical History:  Procedure Laterality  Date  . ANTERIOR CERVICAL DECOMP/DISCECTOMY FUSION N/A 04/05/2019   Procedure: Cervical three-four, Cervical six-seven  ANTERIOR CERVICAL DECOMPRESSION/DISCECTOMY FUSION 2 LEVELS, ALLOGRAFT, PLATES;  Surgeon: Marybelle Killings, MD;  Location: [facility];  Service: Orthopedics;  Laterality: N/A;  . COLONOSCOPY    . THYROIDECTOMY     Social History   Occupational History  . Not on file  Tobacco Use  . Smoking status: Former Research scientist (life sciences)  . Smokeless tobacco: Never Used  . Tobacco comment: 33 years ago  Substance and Sexual Activity  . Alcohol use: Yes    Alcohol/week: 2.0 standard drinks    Types: 2 Cans of beer per week  . Drug use: Never  . Sexual activity: Not on file

## 2019-11-01 ENCOUNTER — Ambulatory Visit: Payer: Medicare Other

## 2019-11-10 ENCOUNTER — Ambulatory Visit: Payer: Medicare Other

## 2019-11-29 ENCOUNTER — Ambulatory Visit (INDEPENDENT_AMBULATORY_CARE_PROVIDER_SITE_OTHER): Payer: Medicare Other | Admitting: Orthopedic Surgery

## 2019-11-29 ENCOUNTER — Other Ambulatory Visit: Payer: Self-pay

## 2019-11-29 ENCOUNTER — Encounter: Payer: Self-pay | Admitting: Orthopedic Surgery

## 2019-11-29 DIAGNOSIS — M7542 Impingement syndrome of left shoulder: Secondary | ICD-10-CM

## 2019-12-02 ENCOUNTER — Encounter: Payer: Self-pay | Admitting: Orthopedic Surgery

## 2019-12-02 DIAGNOSIS — M7542 Impingement syndrome of left shoulder: Secondary | ICD-10-CM

## 2019-12-02 MED ORDER — LIDOCAINE HCL 1 % IJ SOLN
5.0000 mL | INTRAMUSCULAR | Status: AC | PRN
  Administered 2019-12-02: 5 mL

## 2019-12-02 MED ORDER — BUPIVACAINE HCL 0.5 % IJ SOLN
9.0000 mL | INTRAMUSCULAR | Status: AC | PRN
  Administered 2019-12-02: 9 mL via INTRA_ARTICULAR

## 2019-12-02 MED ORDER — METHYLPREDNISOLONE ACETATE 40 MG/ML IJ SUSP
40.0000 mg | INTRAMUSCULAR | Status: AC | PRN
  Administered 2019-12-02: 40 mg via INTRA_ARTICULAR

## 2019-12-02 NOTE — Progress Notes (Signed)
Office Visit Note   Patient: Troy Larsen           Date of Birth: 1951/04/17           MRN: [id number] Visit Date: 11/29/2019 Requested by: Jilda Panda, MD [id number] [place],  [place] [address] PCP: Jilda Panda, MD  Subjective: Chief Complaint  Patient presents with  . Left Shoulder - Pain, Follow-up    HPI: Troy Larsen is a 69 y.o. male who presents to the office complaining of left shoulder pain.  Patient returns for 74-month follow-up visit regarding left shoulder pain.  He has been previously diagnosed with impingement syndrome of the left shoulder based upon symptoms and an MRI scan.  He notes that his left shoulder range of motion continues to improve as he gets further and further out from his episode of adhesive capsulitis.  He is taking Advil or Celebrex as needed.  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  He works as a Chief Strategy Officer.  He has a busy schedule over the next coming months with work and so surgery is not an option for him at this time.  He has had good relief with shoulder injections in the past and requests one today.  He is right-handed.                ROS:  All systems reviewed are negative as they relate to the chief complaint within the history of present illness.  Patient denies fevers or chills.  Assessment & Plan: Visit Diagnoses:  1. Impingement syndrome of left shoulder     Plan: Patient is a 69 year old male who presents complaint of left shoulder pain.  This pain bothers him with manual labor and is consistent with impingement syndrome of the left shoulder.  He has no significant weakness on exam today and has good functional range of motion.  Patient requests left shoulder injection today.  A subacromial left shoulder injection was administered and patient tolerated the procedure well.  He will follow-up with the office as needed.  Follow-Up Instructions: No follow-ups on file.    Orders:  No orders of the defined types were placed in this encounter.  No orders of the defined types were placed in this encounter.     Procedures: Large Joint Inj: L subacromial bursa on 12/02/2019 1:54 PM Indications: diagnostic evaluation and pain Details: 18 G 1.5 in needle, posterior approach  Arthrogram: No  Medications: 9 mL bupivacaine 0.5 %; 40 mg methylPREDNISolone acetate 40 MG/ML; 5 mL lidocaine 1 % Outcome: tolerated well, no immediate complications Procedure, treatment alternatives, risks and benefits explained, specific risks discussed. Consent was given by the patient. Immediately prior to procedure a time out was called to verify the correct patient, procedure, equipment, support staff and site/side marked as required. Patient was prepped and draped in the usual sterile fashion.       Clinical Data: No additional findings.  Objective: Vital Signs: There were no vitals taken for this visit.  Physical Exam:  Constitutional: Patient appears well-developed HEENT:  Head: Normocephalic Eyes:EOM are normal Neck: Normal range of motion Cardiovascular: Normal rate Pulmonary/chest: Effort normal Neurologic: Patient is alert Skin: Skin is warm Psychiatric: Patient has normal mood and affect  Ortho Exam:  Left shoulder Exam Positive Neer's and Hawkins impingement signs Able to fully forward flex and abduct shoulder overhead No loss of ER relative to the other shoulder.  Good endpoint with  ER  No TTP over the [facility] joint or bicipital groove Good subscapularis, supraspinatus, and infraspinatus strength 5/5 grip strength, forearm pronation/supination, and bicep strength  Specialty Comments:  No specialty comments available.  Imaging: No results found.   PMFS History: Patient Active Problem List   Diagnosis Date Noted  . S/P cervical spinal fusion 06/13/2019  . Impingement syndrome of left shoulder 03/31/2019  . Cervical cord myelomalacia (HCC)  01/21/2019   No past medical history on file.  No family history on file.  Past Surgical History:  Procedure Laterality Date  . ANTERIOR CERVICAL DECOMP/DISCECTOMY FUSION N/A 04/05/2019   Procedure: Cervical three-four, Cervical six-seven  ANTERIOR CERVICAL DECOMPRESSION/DISCECTOMY FUSION 2 LEVELS, ALLOGRAFT, PLATES;  Surgeon: Eldred Manges, MD;  Location: [facility] OR;  Service: Orthopedics;  Laterality: N/A;  . COLONOSCOPY    . THYROIDECTOMY     Social History   Occupational History  . Not on file  Tobacco Use  . Smoking status: Former Games developer  . Smokeless tobacco: Never Used  . Tobacco comment: 33 years ago  Substance and Sexual Activity  . Alcohol use: Yes    Alcohol/week: 2.0 standard drinks    Types: 2 Cans of beer per week  . Drug use: Never  . Sexual activity: Not on file

## 2020-11-26 IMAGING — MR MR SHOULDER*L* W/O CM
5 series · 35 of 40 positions shown · non-contrast
Comparison: None.

CLINICAL DATA: Left shoulder pain, decreased range of motion

EXAM:
MRI OF THE LEFT SHOULDER WITHOUT CONTRAST
TECHNIQUE: Multiplanar, multisequence MR imaging of the shoulder was performed.
No intravenous contrast was administered.

[Series 4: PD fat-sat · axial · 4.0mm · 0.55mm/px · z∈[-43,+48]mm · 8 of 20 slices shown (1 of 2)]
[im 1/20]
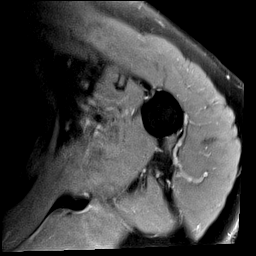
[im 3/20]
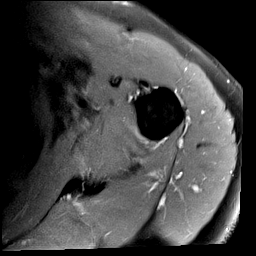
[im 6/20]
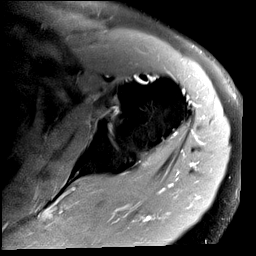
[im 9/20]
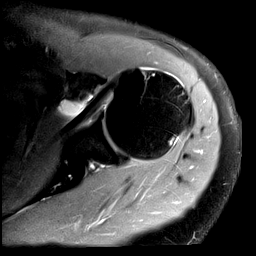
[im 11/20]
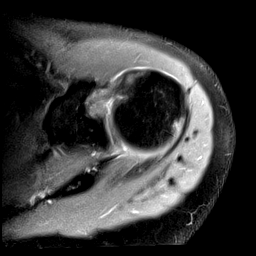
[im 14/20]
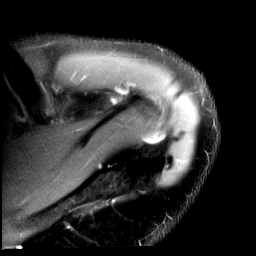
[im 17/20]
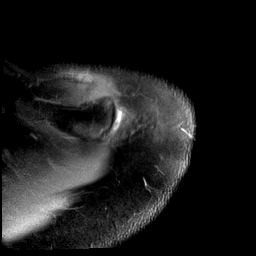
[im 20/20]
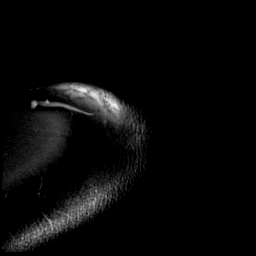

[Series 5: T2 fat-sat · oblique · 4.0mm · 0.55mm/px · 7 of 18 slices shown (1 of 2)]
[im 1/18]
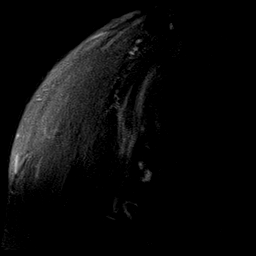
[im 3/18]
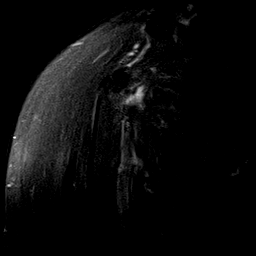
[im 6/18]
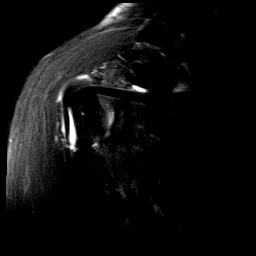
[im 9/18]
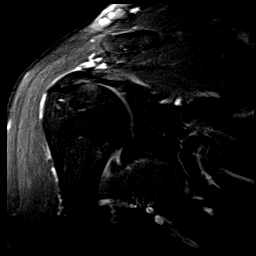
[im 12/18]
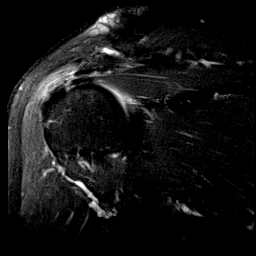
[im 15/18]
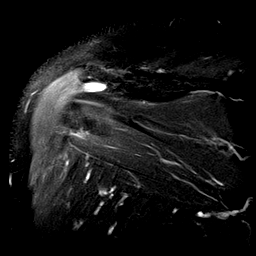
[im 18/18]
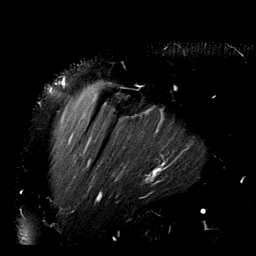

[Series 6: PD fat-sat · oblique · 4.0mm · 0.27mm/px · 8 of 18 slices shown (2 of 2)]
[im 1/18]
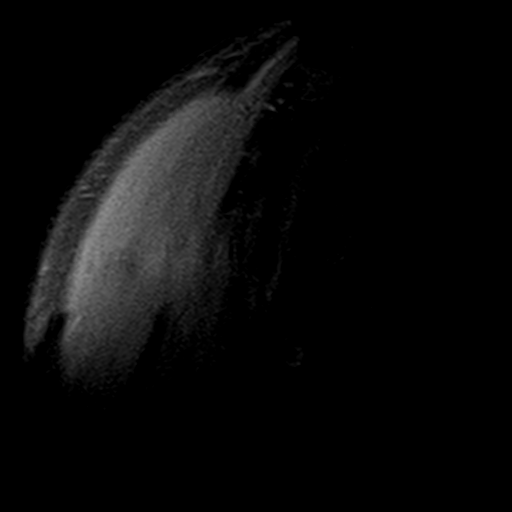
[im 3/18]
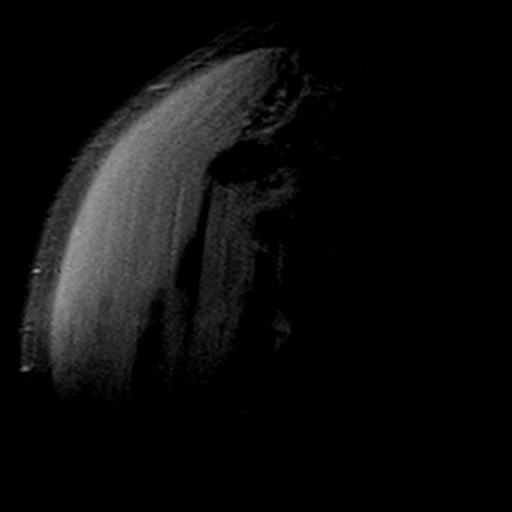
[im 5/18]
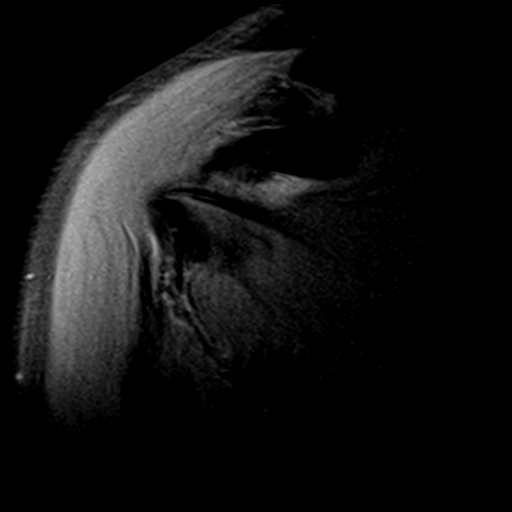
[im 8/18]
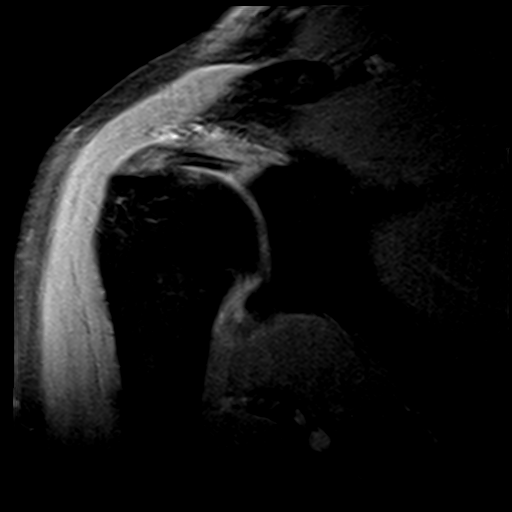
[im 10/18]
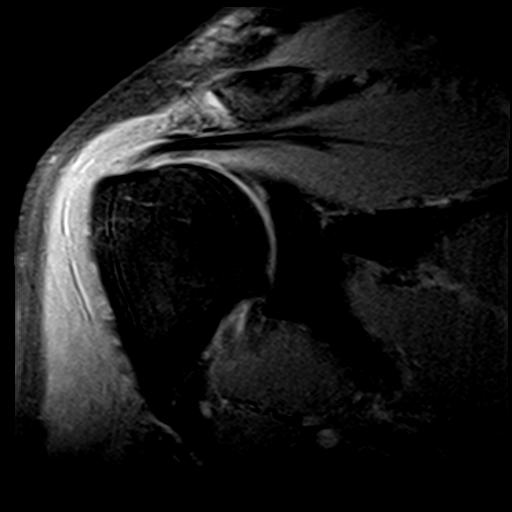
[im 13/18]
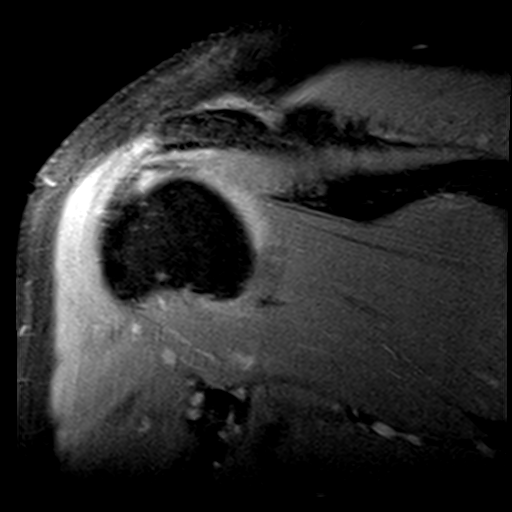
[im 15/18]
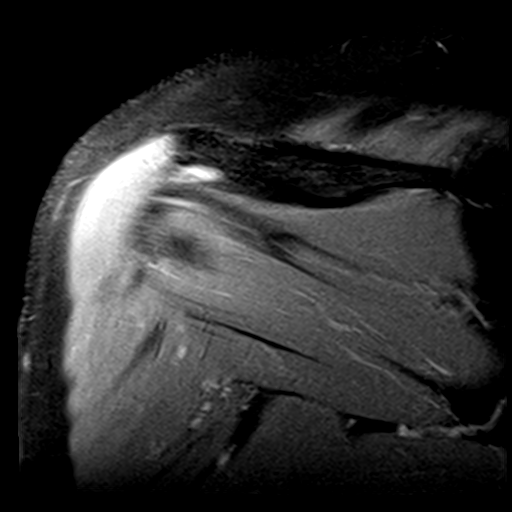
[im 18/18]
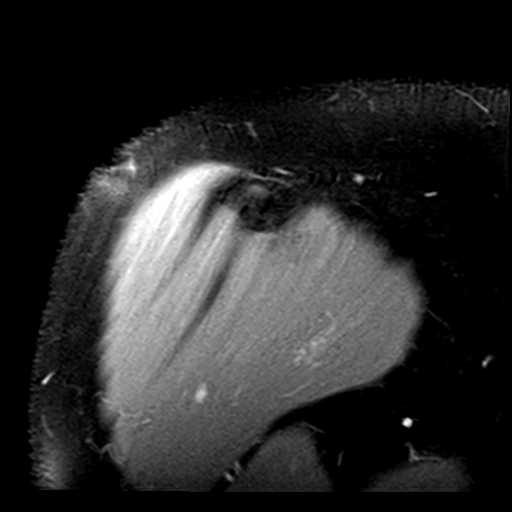

[Series 7: T1 · coronal · 4.0mm · 0.27mm/px · 4 of 20 slices shown]
[im 1/20]
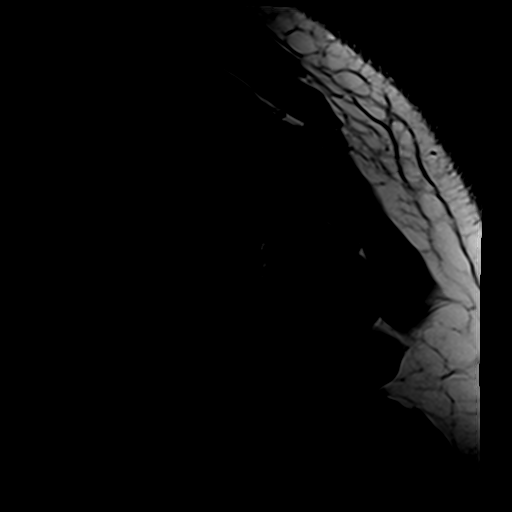
[im 3/20]
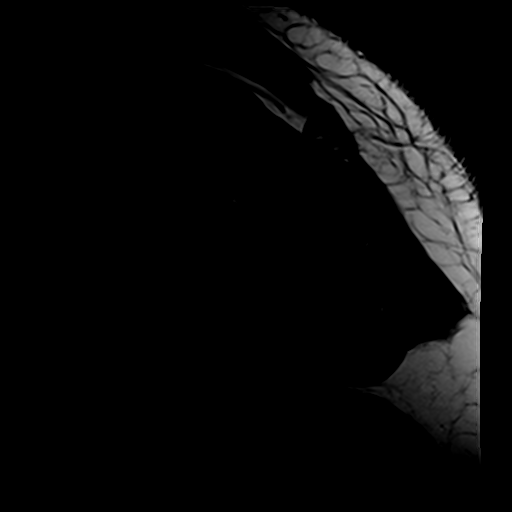
[im 5/20]
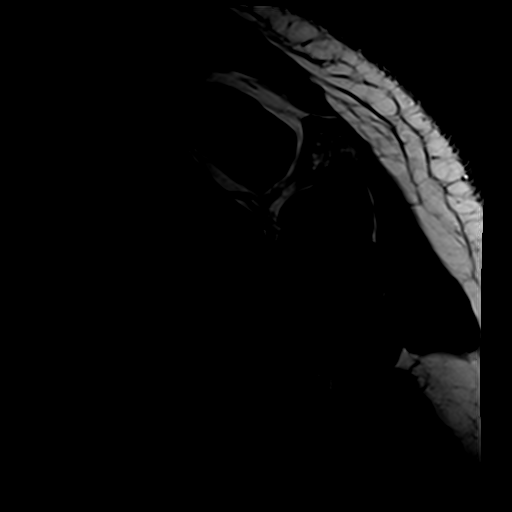
[im 8/20]
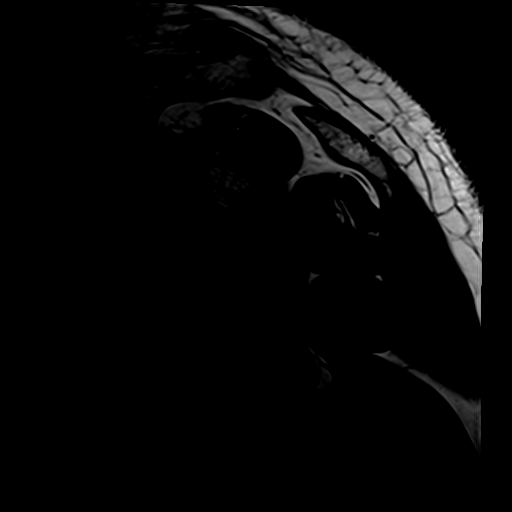

[Series 8: T2 fat-sat · coronal · 4.0mm · 0.55mm/px · 8 of 18 slices shown (2 of 2)]
[im 1/18]
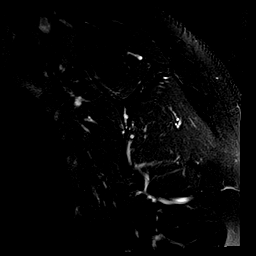
[im 3/18]
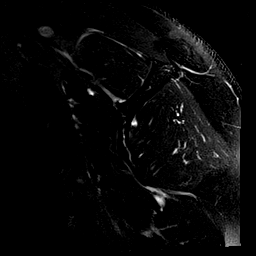
[im 5/18]
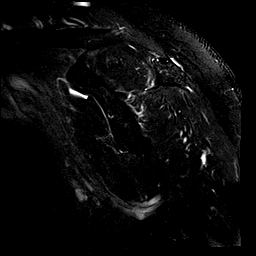
[im 8/18]
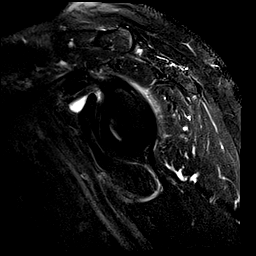
[im 10/18]
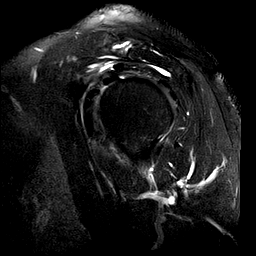
[im 13/18]
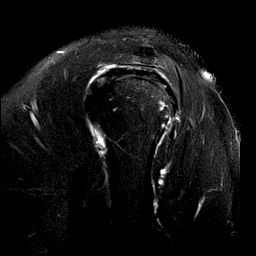
[im 15/18]
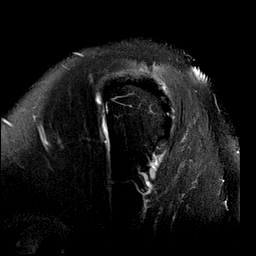
[im 18/18]
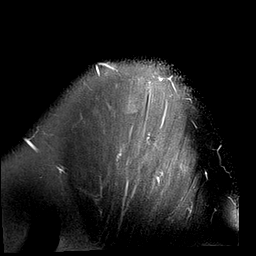

[35 of 40 positions shown; findings below may reference images not displayed]

FINDINGS: Rotator cuff: Moderate tendinosis of the supraspinatus tendon. Mild
tendinosis of the infraspinatus tendon with a small
partial-thickness articular surface tear. Teres minor tendon is
intact. Subscapularis tendon is intact.

Muscles: No atrophy or fatty replacement of nor abnormal signal
within, the muscles of the rotator cuff.

Biceps long head: Severe tendinosis of the intra-articular portion
of the long head of the biceps tendon.

Acromioclavicular Joint: Mild arthropathy of the acromioclavicular
joint. Type I acromion. Trace subacromial/subdeltoid bursal fluid.

Glenohumeral Joint: No joint effusion.  No chondral defect.

Labrum: Limited evaluation secondary lack of intra-articular fluid.
Posterior labral tear.

Bones:  No acute osseous abnormality.  No aggressive osseous lesion

Other: No fluid collection or hematoma
IMPRESSION: 1. Moderate tendinosis of the supraspinatus tendon.
2. Mild tendinosis of the infraspinatus tendon with a small
partial-thickness articular surface tear.
3. Severe tendinosis of the intra-articular portion of the long head
of the biceps tendon.
